# Patient Record
Sex: Male | Born: 1995 | Race: Black or African American | Hispanic: No | Marital: Single | Smoking: Current every day smoker
Health system: Southern US, Community
[De-identification: ages and names within clinical notes are randomized; demographics above are authoritative.]

## PROBLEM LIST (undated history)

## (undated) DIAGNOSIS — Y249XXA Unspecified firearm discharge, undetermined intent, initial encounter: Secondary | ICD-10-CM

## (undated) DIAGNOSIS — G8220 Paraplegia, unspecified: Secondary | ICD-10-CM

## (undated) DIAGNOSIS — S21339A Puncture wound without foreign body of unspecified front wall of thorax with penetration into thoracic cavity, initial encounter: Secondary | ICD-10-CM

## (undated) DIAGNOSIS — W3400XA Accidental discharge from unspecified firearms or gun, initial encounter: Secondary | ICD-10-CM

## (undated) DIAGNOSIS — Z789 Other specified health status: Secondary | ICD-10-CM

## (undated) DIAGNOSIS — G8929 Other chronic pain: Secondary | ICD-10-CM

## (undated) DIAGNOSIS — I10 Essential (primary) hypertension: Secondary | ICD-10-CM

## (undated) HISTORY — DX: Other chronic pain: G89.29

## (undated) HISTORY — PX: MENISCUS REPAIR: SHX5179

## (undated) HISTORY — DX: Puncture wound without foreign body of unspecified front wall of thorax with penetration into thoracic cavity, initial encounter: S21.339A

## (undated) HISTORY — DX: Paraplegia, unspecified: G82.20

## (undated) HISTORY — DX: Other specified health status: Z78.9

## (undated) HISTORY — DX: Essential (primary) hypertension: I10

---
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
## 2016-12-21 ENCOUNTER — Emergency Department (HOSPITAL_COMMUNITY): Payer: Medicaid Other

## 2016-12-21 ENCOUNTER — Inpatient Hospital Stay (HOSPITAL_COMMUNITY)
Admission: EM | Admit: 2016-12-21 | Discharge: 2017-01-10 | DRG: 199 | Disposition: A | Discharge status: Rehabilitation Facility | Payer: Medicaid Other | Attending: General Surgery | Admitting: General Surgery

## 2016-12-21 DIAGNOSIS — R509 Fever, unspecified: Secondary | ICD-10-CM

## 2016-12-21 DIAGNOSIS — S2241XA Multiple fractures of ribs, right side, initial encounter for closed fracture: Secondary | ICD-10-CM

## 2016-12-21 DIAGNOSIS — Z9689 Presence of other specified functional implants: Secondary | ICD-10-CM

## 2016-12-21 DIAGNOSIS — S91342A Puncture wound with foreign body, left foot, initial encounter: Secondary | ICD-10-CM | POA: Diagnosis present

## 2016-12-21 DIAGNOSIS — M62838 Other muscle spasm: Secondary | ICD-10-CM | POA: Diagnosis not present

## 2016-12-21 DIAGNOSIS — S270XXA Traumatic pneumothorax, initial encounter: Principal | ICD-10-CM | POA: Diagnosis present

## 2016-12-21 DIAGNOSIS — Z938 Other artificial opening status: Secondary | ICD-10-CM

## 2016-12-21 DIAGNOSIS — D62 Acute posthemorrhagic anemia: Secondary | ICD-10-CM | POA: Diagnosis present

## 2016-12-21 DIAGNOSIS — J95811 Postprocedural pneumothorax: Secondary | ICD-10-CM | POA: Diagnosis not present

## 2016-12-21 DIAGNOSIS — S21139A Puncture wound without foreign body of unspecified front wall of thorax without penetration into thoracic cavity, initial encounter: Secondary | ICD-10-CM

## 2016-12-21 DIAGNOSIS — S2241XB Multiple fractures of ribs, right side, initial encounter for open fracture: Secondary | ICD-10-CM | POA: Diagnosis present

## 2016-12-21 DIAGNOSIS — Z9889 Other specified postprocedural states: Secondary | ICD-10-CM

## 2016-12-21 DIAGNOSIS — W3400XA Accidental discharge from unspecified firearms or gun, initial encounter: Secondary | ICD-10-CM

## 2016-12-21 DIAGNOSIS — S22069B Unspecified fracture of T7-T8 vertebra, initial encounter for open fracture: Secondary | ICD-10-CM | POA: Diagnosis present

## 2016-12-21 DIAGNOSIS — J9382 Other air leak: Secondary | ICD-10-CM | POA: Diagnosis not present

## 2016-12-21 DIAGNOSIS — S0182XA Laceration with foreign body of other part of head, initial encounter: Secondary | ICD-10-CM | POA: Diagnosis present

## 2016-12-21 DIAGNOSIS — Z833 Family history of diabetes mellitus: Secondary | ICD-10-CM

## 2016-12-21 DIAGNOSIS — K5900 Constipation, unspecified: Secondary | ICD-10-CM | POA: Diagnosis present

## 2016-12-21 DIAGNOSIS — I959 Hypotension, unspecified: Secondary | ICD-10-CM | POA: Diagnosis present

## 2016-12-21 DIAGNOSIS — T794XXA Traumatic shock, initial encounter: Secondary | ICD-10-CM | POA: Diagnosis present

## 2016-12-21 DIAGNOSIS — J942 Hemothorax: Secondary | ICD-10-CM

## 2016-12-21 DIAGNOSIS — G8221 Paraplegia, complete: Secondary | ICD-10-CM | POA: Diagnosis present

## 2016-12-21 DIAGNOSIS — D72829 Elevated white blood cell count, unspecified: Secondary | ICD-10-CM | POA: Diagnosis present

## 2016-12-21 DIAGNOSIS — J939 Pneumothorax, unspecified: Secondary | ICD-10-CM

## 2016-12-21 DIAGNOSIS — S24109A Unspecified injury at unspecified level of thoracic spinal cord, initial encounter: Secondary | ICD-10-CM

## 2016-12-21 LAB — I-STAT CHEM 8, ED
BUN: 10 mg/dL (ref 6–20)
Calcium, Ion: 1.1 mmol/L — ABNORMAL LOW (ref 1.15–1.40)
Chloride: 104 mmol/L (ref 101–111)
Creatinine, Ser: 1.4 mg/dL — ABNORMAL HIGH (ref 0.61–1.24)
Glucose, Bld: 169 mg/dL — ABNORMAL HIGH (ref 65–99)
HCT: 41 % (ref 39.0–52.0)
Hemoglobin: 13.9 g/dL (ref 13.0–17.0)
Potassium: 4.1 mmol/L (ref 3.5–5.1)
Sodium: 140 mmol/L (ref 135–145)
TCO2: 20 mmol/L — ABNORMAL LOW (ref 22–32)

## 2016-12-21 LAB — TYPE AND SCREEN
ABO/RH(D): A POS
ANTIBODY SCREEN: NEGATIVE
Unit division: 0
Unit division: 0

## 2016-12-21 LAB — CBC
HCT: 39.2 % (ref 39.0–52.0)
Hemoglobin: 13.3 g/dL (ref 13.0–17.0)
MCH: 29.8 pg (ref 26.0–34.0)
MCHC: 33.9 g/dL (ref 30.0–36.0)
MCV: 87.7 fL (ref 78.0–100.0)
Platelets: 287 10*3/uL (ref 150–400)
RBC: 4.47 MIL/uL (ref 4.22–5.81)
RDW: 12.8 % (ref 11.5–15.5)
WBC: 6.1 10*3/uL (ref 4.0–10.5)

## 2016-12-21 LAB — PREPARE FRESH FROZEN PLASMA
UNIT DIVISION: 0
UNIT DIVISION: 0

## 2016-12-21 LAB — BPAM FFP
BLOOD PRODUCT EXPIRATION DATE: 201809152359
Blood Product Expiration Date: 201809152359
ISSUE DATE / TIME: 201809112127
ISSUE DATE / TIME: 201809112127
Unit Type and Rh: 600
Unit Type and Rh: 6200

## 2016-12-21 LAB — COMPREHENSIVE METABOLIC PANEL
ALT: 18 U/L (ref 17–63)
AST: 39 U/L (ref 15–41)
Albumin: 3.9 g/dL (ref 3.5–5.0)
Alkaline Phosphatase: 49 U/L (ref 38–126)
Anion gap: 16 — ABNORMAL HIGH (ref 5–15)
BUN: 8 mg/dL (ref 6–20)
CO2: 17 mmol/L — ABNORMAL LOW (ref 22–32)
Calcium: 8.9 mg/dL (ref 8.9–10.3)
Chloride: 105 mmol/L (ref 101–111)
Creatinine, Ser: 1.61 mg/dL — ABNORMAL HIGH (ref 0.61–1.24)
GFR calc Af Amer: >60 mL/min (ref >60)
GFR calc non Af Amer: >60 mL/min (ref >60)
Glucose, Bld: 171 mg/dL — ABNORMAL HIGH (ref 65–99)
Potassium: 4 mmol/L (ref 3.5–5.1)
Sodium: 138 mmol/L (ref 135–145)
Total Bilirubin: 1.2 mg/dL (ref 0.3–1.2)
Total Protein: 6.1 g/dL — ABNORMAL LOW (ref 6.5–8.1)

## 2016-12-21 LAB — BPAM RBC
BLOOD PRODUCT EXPIRATION DATE: 201809122359
BLOOD PRODUCT EXPIRATION DATE: 201809132359
ISSUE DATE / TIME: 201809112126
ISSUE DATE / TIME: 201809112126
UNIT TYPE AND RH: 9500
Unit Type and Rh: 9500

## 2016-12-21 LAB — ETHANOL: Alcohol, Ethyl (B): <5 mg/dL (ref <5)

## 2016-12-21 LAB — PROTIME-INR
INR: 1.09
Prothrombin Time: 14 seconds (ref 11.4–15.2)

## 2016-12-21 LAB — I-STAT CG4 LACTIC ACID, ED: Lactic Acid, Venous: 10.31 mmol/L (ref 0.5–1.9)

## 2016-12-21 LAB — ABO/RH: ABO/RH(D): A POS

## 2016-12-21 LAB — CDS SEROLOGY

## 2016-12-21 MED ORDER — FENTANYL CITRATE (PF) 100 MCG/2ML IJ SOLN
INTRAMUSCULAR | Status: AC | PRN
  Administered 2016-12-21: 50 ug via INTRAVENOUS

## 2016-12-21 MED ORDER — SODIUM CHLORIDE 0.9 % IV SOLN
INTRAVENOUS | Status: AC | PRN
  Administered 2016-12-21 (×2): 1000 mL via INTRAVENOUS
  Administered 2016-12-21: 100 mL/h via INTRAVENOUS

## 2016-12-21 MED ORDER — IOPAMIDOL (ISOVUE-300) INJECTION 61%
INTRAVENOUS | Status: AC
  Filled 2016-12-21: qty 100

## 2016-12-21 MED ORDER — KETAMINE HCL-SODIUM CHLORIDE 100-0.9 MG/10ML-% IV SOSY
PREFILLED_SYRINGE | INTRAVENOUS | Status: AC
  Filled 2016-12-21: qty 10

## 2016-12-21 MED ORDER — IOPAMIDOL (ISOVUE-300) INJECTION 61%
INTRAVENOUS | Status: AC
  Administered 2016-12-21: 75 mL
  Filled 2016-12-21: qty 75

## 2016-12-21 MED ORDER — KETAMINE HCL-SODIUM CHLORIDE 100-0.9 MG/10ML-% IV SOSY
100.0000 mg | PREFILLED_SYRINGE | Freq: Once | INTRAVENOUS | Status: AC
  Administered 2016-12-21: 100 mg via INTRAVENOUS

## 2016-12-21 NOTE — ED Notes (Signed)
TO CT AT THIS TIME
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
## 2016-12-21 NOTE — ED Notes (Signed)
UNABLE TO OBTAIN A MANUAL BLOOD PRESSURE. PT DIAPHORETIC, REMAINS ALERT.

## 2016-12-21 NOTE — ED Notes (Signed)
KOHUT NOTIFIED OF LACTIC ACID 10.31
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
## 2016-12-21 NOTE — ED Notes (Signed)
See physical diagram for wounds
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
## 2016-12-21 NOTE — ED Notes (Signed)
1524.94 in cash, yellow chain with clear stones, yellow pendant with clear stones and grey studs, white charger all given to [employer] badge number [id number] at this time.

## 2016-12-21 NOTE — ED Notes (Signed)
Large amount of cash sealed in valuables envelope with Vergia Alconasey H RN
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
## 2016-12-21 NOTE — ED Notes (Signed)
OFFERED TO CALL FAMILY, PT REPORTS "THEY ALREADY KNOW."

## 2016-12-22 ENCOUNTER — Inpatient Hospital Stay (HOSPITAL_COMMUNITY): Payer: Medicaid Other

## 2016-12-22 ENCOUNTER — Encounter (HOSPITAL_COMMUNITY): Payer: Self-pay | Admitting: Emergency Medicine

## 2016-12-22 DIAGNOSIS — I959 Hypotension, unspecified: Secondary | ICD-10-CM | POA: Diagnosis present

## 2016-12-22 DIAGNOSIS — W3400XA Accidental discharge from unspecified firearms or gun, initial encounter: Secondary | ICD-10-CM

## 2016-12-22 DIAGNOSIS — S91342A Puncture wound with foreign body, left foot, initial encounter: Secondary | ICD-10-CM | POA: Diagnosis present

## 2016-12-22 DIAGNOSIS — S22069B Unspecified fracture of T7-T8 vertebra, initial encounter for open fracture: Secondary | ICD-10-CM | POA: Diagnosis present

## 2016-12-22 DIAGNOSIS — S270XXA Traumatic pneumothorax, initial encounter: Secondary | ICD-10-CM | POA: Diagnosis present

## 2016-12-22 DIAGNOSIS — Z833 Family history of diabetes mellitus: Secondary | ICD-10-CM | POA: Diagnosis not present

## 2016-12-22 DIAGNOSIS — S21109A Unspecified open wound of unspecified front wall of thorax without penetration into thoracic cavity, initial encounter: Secondary | ICD-10-CM | POA: Diagnosis not present

## 2016-12-22 DIAGNOSIS — S21139A Puncture wound without foreign body of unspecified front wall of thorax without penetration into thoracic cavity, initial encounter: Secondary | ICD-10-CM

## 2016-12-22 DIAGNOSIS — M62838 Other muscle spasm: Secondary | ICD-10-CM | POA: Diagnosis not present

## 2016-12-22 DIAGNOSIS — R609 Edema, unspecified: Secondary | ICD-10-CM | POA: Diagnosis not present

## 2016-12-22 DIAGNOSIS — S0182XA Laceration with foreign body of other part of head, initial encounter: Secondary | ICD-10-CM | POA: Diagnosis present

## 2016-12-22 DIAGNOSIS — S24103S Unspecified injury at T7-T10 level of thoracic spinal cord, sequela: Secondary | ICD-10-CM | POA: Diagnosis not present

## 2016-12-22 DIAGNOSIS — J95811 Postprocedural pneumothorax: Secondary | ICD-10-CM | POA: Diagnosis not present

## 2016-12-22 DIAGNOSIS — M549 Dorsalgia, unspecified: Secondary | ICD-10-CM | POA: Diagnosis present

## 2016-12-22 DIAGNOSIS — G8220 Paraplegia, unspecified: Secondary | ICD-10-CM | POA: Diagnosis not present

## 2016-12-22 DIAGNOSIS — J9382 Other air leak: Secondary | ICD-10-CM | POA: Diagnosis not present

## 2016-12-22 DIAGNOSIS — D62 Acute posthemorrhagic anemia: Secondary | ICD-10-CM | POA: Diagnosis present

## 2016-12-22 DIAGNOSIS — G8221 Paraplegia, complete: Secondary | ICD-10-CM | POA: Diagnosis present

## 2016-12-22 DIAGNOSIS — T794XXA Traumatic shock, initial encounter: Secondary | ICD-10-CM | POA: Diagnosis present

## 2016-12-22 DIAGNOSIS — K5900 Constipation, unspecified: Secondary | ICD-10-CM | POA: Diagnosis present

## 2016-12-22 DIAGNOSIS — S2241XB Multiple fractures of ribs, right side, initial encounter for open fracture: Secondary | ICD-10-CM | POA: Diagnosis present

## 2016-12-22 DIAGNOSIS — D72829 Elevated white blood cell count, unspecified: Secondary | ICD-10-CM | POA: Diagnosis present

## 2016-12-22 LAB — CBC
HCT: 33 % — ABNORMAL LOW (ref 39.0–52.0)
Hemoglobin: 10.9 g/dL — ABNORMAL LOW (ref 13.0–17.0)
MCH: 29 pg (ref 26.0–34.0)
MCHC: 33 g/dL (ref 30.0–36.0)
MCV: 87.8 fL (ref 78.0–100.0)
PLATELETS: 243 10*3/uL (ref 150–400)
RBC: 3.76 MIL/uL — AB (ref 4.22–5.81)
RDW: 12.8 % (ref 11.5–15.5)
WBC: 22.1 10*3/uL — ABNORMAL HIGH (ref 4.0–10.5)

## 2016-12-22 LAB — URINALYSIS, ROUTINE W REFLEX MICROSCOPIC
Bilirubin Urine: NEGATIVE
Glucose, UA: NEGATIVE mg/dL
Hgb urine dipstick: NEGATIVE
Ketones, ur: NEGATIVE mg/dL
Leukocytes, UA: NEGATIVE
Nitrite: NEGATIVE
Protein, ur: 100 mg/dL — AB
Specific Gravity, Urine: 1.02 (ref 1.005–1.030)
Squamous Epithelial / LPF: NONE SEEN
pH: 7 (ref 5.0–8.0)

## 2016-12-22 LAB — BASIC METABOLIC PANEL
Anion gap: 5 (ref 5–15)
BUN: 8 mg/dL (ref 6–20)
CHLORIDE: 109 mmol/L (ref 101–111)
CO2: 24 mmol/L (ref 22–32)
CREATININE: 1.46 mg/dL — AB (ref 0.61–1.24)
Calcium: 7.9 mg/dL — ABNORMAL LOW (ref 8.9–10.3)
GFR calc non Af Amer: >60 mL/min (ref >60)
GLUCOSE: 158 mg/dL — AB (ref 65–99)
Potassium: 5.6 mmol/L — ABNORMAL HIGH (ref 3.5–5.1)
Sodium: 138 mmol/L (ref 135–145)

## 2016-12-22 LAB — PROTIME-INR
INR: 1.21
PROTHROMBIN TIME: 15.2 s (ref 11.4–15.2)

## 2016-12-22 LAB — MRSA PCR SCREENING: MRSA by PCR: NEGATIVE

## 2016-12-22 LAB — BLOOD PRODUCT ORDER (VERBAL) VERIFICATION

## 2016-12-22 LAB — LACTIC ACID, PLASMA: LACTIC ACID, VENOUS: 4.9 mmol/L — AB (ref 0.5–1.9)

## 2016-12-22 LAB — HIV ANTIBODY (ROUTINE TESTING W REFLEX): HIV Screen 4th Generation wRfx: NONREACTIVE

## 2016-12-22 MED ORDER — MORPHINE SULFATE (PF) 4 MG/ML IV SOLN
2.0000 mg | INTRAVENOUS | Status: DC | PRN
  Administered 2016-12-22 – 2016-12-24 (×21): 4 mg via INTRAVENOUS
  Administered 2016-12-25: 2 mg via INTRAVENOUS
  Filled 2016-12-22 (×23): qty 1

## 2016-12-22 MED ORDER — ACETAMINOPHEN 325 MG PO TABS
650.0000 mg | ORAL_TABLET | Freq: Four times a day (QID) | ORAL | Status: DC | PRN
  Administered 2016-12-22 – 2017-01-07 (×14): 650 mg via ORAL
  Filled 2016-12-22 (×14): qty 2

## 2016-12-22 MED ORDER — ONDANSETRON 4 MG PO TBDP
4.0000 mg | ORAL_TABLET | Freq: Four times a day (QID) | ORAL | Status: DC | PRN
  Filled 2016-12-22: qty 1

## 2016-12-22 MED ORDER — MORPHINE SULFATE (PF) 4 MG/ML IV SOLN
4.0000 mg | Freq: Once | INTRAVENOUS | Status: AC
  Administered 2016-12-22: 4 mg via INTRAVENOUS
  Filled 2016-12-22: qty 1

## 2016-12-22 MED ORDER — SODIUM CHLORIDE 0.9 % IV SOLN
INTRAVENOUS | Status: DC
  Administered 2016-12-22 – 2016-12-26 (×6): via INTRAVENOUS

## 2016-12-22 MED ORDER — PANTOPRAZOLE SODIUM 40 MG PO TBEC
40.0000 mg | DELAYED_RELEASE_TABLET | Freq: Every day | ORAL | Status: DC
  Administered 2016-12-23 – 2017-01-10 (×19): 40 mg via ORAL
  Filled 2016-12-22 (×20): qty 1

## 2016-12-22 MED ORDER — DIPHENHYDRAMINE HCL 25 MG PO CAPS
50.0000 mg | ORAL_CAPSULE | Freq: Four times a day (QID) | ORAL | Status: DC | PRN
  Administered 2016-12-22 – 2016-12-27 (×9): 50 mg via ORAL
  Filled 2016-12-22 (×9): qty 2

## 2016-12-22 MED ORDER — ONDANSETRON HCL 4 MG/2ML IJ SOLN
4.0000 mg | Freq: Four times a day (QID) | INTRAMUSCULAR | Status: DC | PRN
  Administered 2016-12-22 – 2016-12-26 (×2): 4 mg via INTRAVENOUS
  Filled 2016-12-22 (×2): qty 2

## 2016-12-22 MED ORDER — PANTOPRAZOLE SODIUM 40 MG IV SOLR
40.0000 mg | Freq: Every day | INTRAVENOUS | Status: DC
  Administered 2016-12-22: 40 mg via INTRAVENOUS
  Filled 2016-12-22: qty 40
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
## 2016-12-23 ENCOUNTER — Inpatient Hospital Stay (HOSPITAL_COMMUNITY): Payer: Medicaid Other

## 2016-12-23 LAB — CBC WITH DIFFERENTIAL/PLATELET
Basophils Absolute: 0 10*3/uL (ref 0.0–0.1)
Basophils Relative: 0 %
EOS PCT: 0 %
Eosinophils Absolute: 0 10*3/uL (ref 0.0–0.7)
HEMATOCRIT: 26.9 % — AB (ref 39.0–52.0)
HEMOGLOBIN: 8.8 g/dL — AB (ref 13.0–17.0)
LYMPHS ABS: 1.3 10*3/uL (ref 0.7–4.0)
LYMPHS PCT: 7 %
MCH: 28.6 pg (ref 26.0–34.0)
MCHC: 32.7 g/dL (ref 30.0–36.0)
MCV: 87.3 fL (ref 78.0–100.0)
Monocytes Absolute: 1.6 10*3/uL — ABNORMAL HIGH (ref 0.1–1.0)
Monocytes Relative: 9 %
NEUTROS ABS: 14.8 10*3/uL — AB (ref 1.7–7.7)
Neutrophils Relative %: 84 %
Platelets: 209 10*3/uL (ref 150–400)
RBC: 3.08 MIL/uL — AB (ref 4.22–5.81)
RDW: 13 % (ref 11.5–15.5)
WBC: 17.6 10*3/uL — AB (ref 4.0–10.5)

## 2016-12-23 LAB — BASIC METABOLIC PANEL
ANION GAP: 5 (ref 5–15)
BUN: 9 mg/dL (ref 6–20)
CHLORIDE: 101 mmol/L (ref 101–111)
CO2: 28 mmol/L (ref 22–32)
Calcium: 8.2 mg/dL — ABNORMAL LOW (ref 8.9–10.3)
Creatinine, Ser: 1.19 mg/dL (ref 0.61–1.24)
GFR calc Af Amer: >60 mL/min (ref >60)
GFR calc non Af Amer: >60 mL/min (ref >60)
GLUCOSE: 127 mg/dL — AB (ref 65–99)
POTASSIUM: 4 mmol/L (ref 3.5–5.1)
Sodium: 134 mmol/L — ABNORMAL LOW (ref 135–145)

## 2016-12-23 LAB — CBC
HEMATOCRIT: 25.4 % — AB (ref 39.0–52.0)
HEMOGLOBIN: 8.6 g/dL — AB (ref 13.0–17.0)
MCH: 29.5 pg (ref 26.0–34.0)
MCHC: 33.9 g/dL (ref 30.0–36.0)
MCV: 87 fL (ref 78.0–100.0)
Platelets: 204 10*3/uL (ref 150–400)
RBC: 2.92 MIL/uL — ABNORMAL LOW (ref 4.22–5.81)
RDW: 12.7 % (ref 11.5–15.5)
WBC: 19.3 10*3/uL — ABNORMAL HIGH (ref 4.0–10.5)

## 2016-12-23 MED ORDER — METOPROLOL TARTRATE 12.5 MG HALF TABLET
12.5000 mg | ORAL_TABLET | Freq: Two times a day (BID) | ORAL | Status: DC
  Administered 2016-12-23 – 2017-01-10 (×37): 12.5 mg via ORAL
  Filled 2016-12-23 (×38): qty 1

## 2016-12-23 MED ORDER — IBUPROFEN 200 MG PO TABS
400.0000 mg | ORAL_TABLET | Freq: Three times a day (TID) | ORAL | Status: DC | PRN
  Administered 2016-12-23 – 2016-12-31 (×9): 400 mg via ORAL
  Filled 2016-12-23: qty 2
  Filled 2016-12-23 (×7): qty 1
  Filled 2016-12-23: qty 2

## 2016-12-23 MED ORDER — METOPROLOL TARTRATE 5 MG/5ML IV SOLN
5.0000 mg | Freq: Once | INTRAVENOUS | Status: DC
  Filled 2016-12-23: qty 5

## 2016-12-23 MED ORDER — OXYCODONE HCL 5 MG PO TABS
5.0000 mg | ORAL_TABLET | ORAL | Status: DC | PRN
  Administered 2016-12-23 – 2016-12-25 (×7): 10 mg via ORAL
  Filled 2016-12-23 (×7): qty 2

## 2016-12-23 MED ORDER — METHOCARBAMOL 750 MG PO TABS
750.0000 mg | ORAL_TABLET | Freq: Three times a day (TID) | ORAL | Status: DC | PRN
  Administered 2016-12-25 – 2017-01-03 (×18): 750 mg via ORAL
  Filled 2016-12-23 (×8): qty 1
  Filled 2016-12-23: qty 2
  Filled 2016-12-23 (×3): qty 1
  Filled 2016-12-23: qty 2
  Filled 2016-12-23 (×2): qty 1
  Filled 2016-12-23: qty 2
  Filled 2016-12-23 (×2): qty 1

## 2016-12-23 MED ORDER — METOPROLOL TARTRATE 5 MG/5ML IV SOLN
5.0000 mg | INTRAVENOUS | Status: DC | PRN
  Administered 2016-12-23 – 2016-12-24 (×3): 5 mg via INTRAVENOUS
  Filled 2016-12-23 (×2): qty 5

## 2016-12-23 MED ORDER — METOPROLOL TARTRATE 5 MG/5ML IV SOLN
INTRAVENOUS | Status: AC
Start: 2016-12-23 — End: 2016-12-23
  Administered 2016-12-23: 5 mg
  Filled 2016-12-23: qty 5
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
## 2016-12-23 NOTE — Progress Notes (Signed)
Rehab Admissions Coordinator Note:  Patient was screened by Clois DupesBoyette, Adonnis Salceda Godwin for appropriateness for an Inpatient Acute Rehab Consult per PT recommendation.   At this time, we are recommending Inpatient Rehab consult.  Clois DupesBoyette, Harsha Yusko Godwin 12/23/2016, 3:48 PM  I can be reached at [phone].
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
## 2016-12-24 ENCOUNTER — Encounter (HOSPITAL_COMMUNITY): Payer: Self-pay | Admitting: Physical Medicine and Rehabilitation

## 2016-12-24 ENCOUNTER — Inpatient Hospital Stay (HOSPITAL_COMMUNITY): Payer: Medicaid Other

## 2016-12-24 DIAGNOSIS — S24103S Unspecified injury at T7-T10 level of thoracic spinal cord, sequela: Secondary | ICD-10-CM

## 2016-12-24 DIAGNOSIS — G8220 Paraplegia, unspecified: Secondary | ICD-10-CM

## 2016-12-24 LAB — URINALYSIS, ROUTINE W REFLEX MICROSCOPIC
Bilirubin Urine: NEGATIVE
Glucose, UA: NEGATIVE mg/dL
Hgb urine dipstick: NEGATIVE
Ketones, ur: 5 mg/dL — AB
NITRITE: NEGATIVE
PH: 6 (ref 5.0–8.0)
PROTEIN: 30 mg/dL — AB
SPECIFIC GRAVITY, URINE: 1.03 (ref 1.005–1.030)

## 2016-12-24 LAB — BASIC METABOLIC PANEL
Anion gap: 5 (ref 5–15)
BUN: 7 mg/dL (ref 6–20)
CALCIUM: 8.2 mg/dL — AB (ref 8.9–10.3)
CO2: 28 mmol/L (ref 22–32)
CREATININE: 1.24 mg/dL (ref 0.61–1.24)
Chloride: 104 mmol/L (ref 101–111)
GFR calc non Af Amer: >60 mL/min (ref >60)
Glucose, Bld: 113 mg/dL — ABNORMAL HIGH (ref 65–99)
Potassium: 3.7 mmol/L (ref 3.5–5.1)
SODIUM: 137 mmol/L (ref 135–145)

## 2016-12-24 LAB — CBC
HEMATOCRIT: 22.3 % — AB (ref 39.0–52.0)
HEMOGLOBIN: 7.6 g/dL — AB (ref 13.0–17.0)
MCH: 29.6 pg (ref 26.0–34.0)
MCHC: 34.1 g/dL (ref 30.0–36.0)
MCV: 86.8 fL (ref 78.0–100.0)
Platelets: 186 10*3/uL (ref 150–400)
RBC: 2.57 MIL/uL — ABNORMAL LOW (ref 4.22–5.81)
RDW: 13.1 % (ref 11.5–15.5)
WBC: 14.4 10*3/uL — ABNORMAL HIGH (ref 4.0–10.5)

## 2016-12-24 MED ORDER — BACITRACIN ZINC 500 UNIT/GM EX OINT
1.0000 "application " | TOPICAL_OINTMENT | Freq: Two times a day (BID) | CUTANEOUS | Status: DC
  Administered 2016-12-24 – 2017-01-10 (×32): 1 via TOPICAL
  Filled 2016-12-24 (×3): qty 28.35
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
## 2016-12-24 NOTE — Clinical Social Work Note (Signed)
Clinical Social Worker continuing to follow patient and family for support and discharge planning needs.  CSW spoke with patient and patient girlfriend at bedside who are planning for inpatient rehab and home at wheelchair level.  Patient inquired about chances of walking - MD notified and will speak with patient at bedside.  Clinical Social Worker inquired about current substance use.  Patient states that he uses marijuana daily but no concerns with any other substance.  Patient is willing to cease use and open to resources once discharged from inpatient rehab.  SBIRT completed.  CSW remains available for support and to assist with discharge planning needs.  Macario Golds, [place] [phone]

## 2016-12-24 NOTE — Progress Notes (Signed)
I met with pt and his girlfriend at bedside. We discussed an inpt rehab admit and he is in agreement. We discussed bowel and bladder programs and education. Pt asking many questions about whether he will be able to walk or not. I referred him to discuss with his MDs. He understands the expectation will be home at wheelchair level at this time. I will follow up Monday for admission when medically ready. [phone]
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
## 2016-12-25 ENCOUNTER — Inpatient Hospital Stay (HOSPITAL_COMMUNITY): Payer: Medicaid Other

## 2016-12-25 LAB — CBC
HCT: 22.8 % — ABNORMAL LOW (ref 39.0–52.0)
Hemoglobin: 7.6 g/dL — ABNORMAL LOW (ref 13.0–17.0)
MCH: 29 pg (ref 26.0–34.0)
MCHC: 33.3 g/dL (ref 30.0–36.0)
MCV: 87 fL (ref 78.0–100.0)
PLATELETS: 222 10*3/uL (ref 150–400)
RBC: 2.62 MIL/uL — ABNORMAL LOW (ref 4.22–5.81)
RDW: 13.2 % (ref 11.5–15.5)
WBC: 14.6 10*3/uL — AB (ref 4.0–10.5)

## 2016-12-25 LAB — URINE CULTURE
Culture: NO GROWTH
Special Requests: NORMAL

## 2016-12-25 LAB — BASIC METABOLIC PANEL
ANION GAP: 6 (ref 5–15)
BUN: 8 mg/dL (ref 6–20)
CALCIUM: 7.9 mg/dL — AB (ref 8.9–10.3)
CHLORIDE: 104 mmol/L (ref 101–111)
CO2: 26 mmol/L (ref 22–32)
CREATININE: 1.25 mg/dL — AB (ref 0.61–1.24)
GFR calc non Af Amer: >60 mL/min (ref >60)
Glucose, Bld: 131 mg/dL — ABNORMAL HIGH (ref 65–99)
Potassium: 3.1 mmol/L — ABNORMAL LOW (ref 3.5–5.1)
SODIUM: 136 mmol/L (ref 135–145)

## 2016-12-25 MED ORDER — MINERAL OIL RE ENEM
1.0000 | ENEMA | Freq: Every day | RECTAL | Status: DC | PRN
  Administered 2016-12-31: 1 via RECTAL
  Filled 2016-12-25 (×3): qty 1

## 2016-12-25 MED ORDER — OXYCODONE HCL 5 MG PO TABS
5.0000 mg | ORAL_TABLET | ORAL | Status: DC | PRN
  Administered 2016-12-25: 10 mg via ORAL
  Administered 2016-12-25 (×2): 15 mg via ORAL
  Administered 2016-12-26: 10 mg via ORAL
  Administered 2016-12-26 – 2017-01-03 (×44): 15 mg via ORAL
  Administered 2017-01-04: 5 mg via ORAL
  Administered 2017-01-04 (×2): 15 mg via ORAL
  Administered 2017-01-04: 5 mg via ORAL
  Administered 2017-01-04: 10 mg via ORAL
  Administered 2017-01-04 – 2017-01-07 (×16): 15 mg via ORAL
  Administered 2017-01-07: 10 mg via ORAL
  Administered 2017-01-07 – 2017-01-08 (×7): 15 mg via ORAL
  Administered 2017-01-09: 10 mg via ORAL
  Administered 2017-01-09 (×3): 15 mg via ORAL
  Administered 2017-01-09: 5 mg via ORAL
  Administered 2017-01-09 – 2017-01-10 (×4): 15 mg via ORAL
  Filled 2016-12-25 (×8): qty 3
  Filled 2016-12-25: qty 2
  Filled 2016-12-25 (×15): qty 3
  Filled 2016-12-25: qty 2
  Filled 2016-12-25 (×46): qty 3
  Filled 2016-12-25: qty 2
  Filled 2016-12-25 (×3): qty 3
  Filled 2016-12-25: qty 2
  Filled 2016-12-25: qty 3
  Filled 2016-12-25: qty 1
  Filled 2016-12-25 (×9): qty 3

## 2016-12-25 MED ORDER — POTASSIUM CHLORIDE CRYS ER 20 MEQ PO TBCR
20.0000 meq | EXTENDED_RELEASE_TABLET | Freq: Two times a day (BID) | ORAL | Status: AC
  Administered 2016-12-25 (×2): 20 meq via ORAL
  Filled 2016-12-25 (×2): qty 1

## 2016-12-25 MED ORDER — MORPHINE SULFATE (PF) 4 MG/ML IV SOLN
2.0000 mg | Freq: Four times a day (QID) | INTRAVENOUS | Status: DC | PRN
  Administered 2016-12-25 – 2016-12-26 (×2): 4 mg via INTRAVENOUS
  Filled 2016-12-25 (×2): qty 1
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
## 2016-12-25 NOTE — Progress Notes (Signed)
Recommend bowel program to begin. I have discussed with pt yesterday. [phone]

## 2016-12-26 ENCOUNTER — Inpatient Hospital Stay (HOSPITAL_COMMUNITY): Payer: Medicaid Other

## 2016-12-26 LAB — CBC
HCT: 21.2 % — ABNORMAL LOW (ref 39.0–52.0)
Hemoglobin: 7.2 g/dL — ABNORMAL LOW (ref 13.0–17.0)
MCH: 29.3 pg (ref 26.0–34.0)
MCHC: 34 g/dL (ref 30.0–36.0)
MCV: 86.2 fL (ref 78.0–100.0)
PLATELETS: 262 10*3/uL (ref 150–400)
RBC: 2.46 MIL/uL — AB (ref 4.22–5.81)
RDW: 12.8 % (ref 11.5–15.5)
WBC: 12.8 10*3/uL — AB (ref 4.0–10.5)

## 2016-12-26 LAB — BASIC METABOLIC PANEL
ANION GAP: 5 (ref 5–15)
BUN: 7 mg/dL (ref 6–20)
CO2: 26 mmol/L (ref 22–32)
Calcium: 8.1 mg/dL — ABNORMAL LOW (ref 8.9–10.3)
Chloride: 107 mmol/L (ref 101–111)
Creatinine, Ser: 1.03 mg/dL (ref 0.61–1.24)
Glucose, Bld: 121 mg/dL — ABNORMAL HIGH (ref 65–99)
POTASSIUM: 3.2 mmol/L — AB (ref 3.5–5.1)
SODIUM: 138 mmol/L (ref 135–145)

## 2016-12-26 MED ORDER — MORPHINE SULFATE (PF) 2 MG/ML IV SOLN
2.0000 mg | Freq: Three times a day (TID) | INTRAVENOUS | Status: DC | PRN
  Administered 2016-12-26 – 2017-01-03 (×7): 2 mg via INTRAVENOUS
  Filled 2016-12-26 (×9): qty 1

## 2016-12-26 MED ORDER — POTASSIUM CHLORIDE CRYS ER 20 MEQ PO TBCR
20.0000 meq | EXTENDED_RELEASE_TABLET | Freq: Two times a day (BID) | ORAL | Status: AC
  Administered 2016-12-26 (×2): 20 meq via ORAL
  Filled 2016-12-26 (×2): qty 1

## 2016-12-26 MED ORDER — ENOXAPARIN SODIUM 40 MG/0.4ML ~~LOC~~ SOLN
40.0000 mg | SUBCUTANEOUS | Status: DC
Start: 2016-12-26 — End: 2017-01-10
  Administered 2016-12-26 – 2017-01-09 (×15): 40 mg via SUBCUTANEOUS
  Filled 2016-12-26 (×15): qty 0.4
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
## 2016-12-26 NOTE — Progress Notes (Signed)
Assumed care from off going RN; throughout shift patient has been alert & oriented;chest tube removed about 1030; complained of pain or soreness; medication was given; check epic charting; patient was given bath and family assisted with putting patient in the chair @ 1420; chest xray completed and results called to DR. Andrey Campanile (Trauma MD). Patient placed on 2L of O2; family @ bedside; chest xray will be repeated early AM  Patient has some small bowel movements ; patient back to bed @ 1800; foley empty; family @ bedside

## 2016-12-26 NOTE — Progress Notes (Signed)
Pt transferred via air overlay bed with RN to [id number]. Receiving RN aware, report given. All belongings sent with patient and girlfriend. Pain medication given prior to transfer.

## 2016-12-27 ENCOUNTER — Inpatient Hospital Stay (HOSPITAL_COMMUNITY): Payer: Medicaid Other

## 2016-12-27 LAB — BASIC METABOLIC PANEL
ANION GAP: 10 (ref 5–15)
BUN: 9 mg/dL (ref 6–20)
CO2: 22 mmol/L (ref 22–32)
Calcium: 8.3 mg/dL — ABNORMAL LOW (ref 8.9–10.3)
Chloride: 105 mmol/L (ref 101–111)
Creatinine, Ser: 0.99 mg/dL (ref 0.61–1.24)
Glucose, Bld: 108 mg/dL — ABNORMAL HIGH (ref 65–99)
POTASSIUM: 3.4 mmol/L — AB (ref 3.5–5.1)
SODIUM: 137 mmol/L (ref 135–145)

## 2016-12-27 LAB — CBC
HCT: 27.1 % — ABNORMAL LOW (ref 39.0–52.0)
Hemoglobin: 9 g/dL — ABNORMAL LOW (ref 13.0–17.0)
MCH: 28.8 pg (ref 26.0–34.0)
MCHC: 33.2 g/dL (ref 30.0–36.0)
MCV: 86.9 fL (ref 78.0–100.0)
PLATELETS: 348 10*3/uL (ref 150–400)
RBC: 3.12 MIL/uL — AB (ref 4.22–5.81)
RDW: 12.9 % (ref 11.5–15.5)
WBC: 11.7 10*3/uL — AB (ref 4.0–10.5)
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
## 2016-12-27 NOTE — Progress Notes (Addendum)
I met with pt at bedside. I await medical readiness for d/c to inpt rehab. Hopeful soon. Pt is in agreement. [phone]

## 2016-12-27 NOTE — Progress Notes (Signed)
Pt arrived to unit via Air overlay bed. Oriented to unit.
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
## 2016-12-27 NOTE — Progress Notes (Signed)
Pt arrived to unit via Air overlay Bed. Oriented to unit.

## 2016-12-27 NOTE — Clinical Social Work Note (Signed)
Clinical Social Worker continuing to follow patient and family for support and discharge planning needs.  Patient will discharge to inpatient rehab once medically stable.  Clinical Social Worker will sign off for now as social work intervention is no longer needed. Please consult [place] again if new need arises.  Macario Golds, [place] [phone]

## 2016-12-27 NOTE — Progress Notes (Signed)
Patient demanding at this time for an "enema".  Patient insisting on being moved "on a lift" and given an "enema" as his "belly feels like its big".  Patient had bowel movement this morning, with positive bowel sounds.  Patient admits to passing gas, as does girlfriend at bedside.  Patient becoming belligerent, cursing stating he needs to have an enema performed at this instant.

## 2016-12-28 ENCOUNTER — Inpatient Hospital Stay (HOSPITAL_COMMUNITY): Payer: Medicaid Other

## 2016-12-28 LAB — CBC
HEMATOCRIT: 23.5 % — AB (ref 39.0–52.0)
Hemoglobin: 8 g/dL — ABNORMAL LOW (ref 13.0–17.0)
MCH: 29.4 pg (ref 26.0–34.0)
MCHC: 34 g/dL (ref 30.0–36.0)
MCV: 86.4 fL (ref 78.0–100.0)
PLATELETS: 410 10*3/uL — AB (ref 150–400)
RBC: 2.72 MIL/uL — AB (ref 4.22–5.81)
RDW: 12.9 % (ref 11.5–15.5)
WBC: 13.2 10*3/uL — AB (ref 4.0–10.5)
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
## 2016-12-29 ENCOUNTER — Inpatient Hospital Stay (HOSPITAL_COMMUNITY): Payer: Medicaid Other

## 2016-12-29 LAB — CBC
HEMATOCRIT: 26 % — AB (ref 39.0–52.0)
Hemoglobin: 8.6 g/dL — ABNORMAL LOW (ref 13.0–17.0)
MCH: 29 pg (ref 26.0–34.0)
MCHC: 33.1 g/dL (ref 30.0–36.0)
MCV: 87.5 fL (ref 78.0–100.0)
PLATELETS: 554 10*3/uL — AB (ref 150–400)
RBC: 2.97 MIL/uL — ABNORMAL LOW (ref 4.22–5.81)
RDW: 13.2 % (ref 11.5–15.5)
WBC: 13.4 10*3/uL — AB (ref 4.0–10.5)

## 2016-12-29 LAB — CULTURE, BLOOD (ROUTINE X 2)
CULTURE: NO GROWTH
CULTURE: NO GROWTH

## 2016-12-29 LAB — BASIC METABOLIC PANEL
Anion gap: 7 (ref 5–15)
BUN: 14 mg/dL (ref 6–20)
CALCIUM: 9.2 mg/dL (ref 8.9–10.3)
CO2: 27 mmol/L (ref 22–32)
CREATININE: 1.03 mg/dL (ref 0.61–1.24)
Chloride: 101 mmol/L (ref 101–111)
GFR calc Af Amer: >60 mL/min (ref >60)
Glucose, Bld: 108 mg/dL — ABNORMAL HIGH (ref 65–99)
POTASSIUM: 4.5 mmol/L (ref 3.5–5.1)
SODIUM: 135 mmol/L (ref 135–145)
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
## 2016-12-29 NOTE — Progress Notes (Addendum)
I met with pt and his girlfriend at bedside. They informed me that his Mom and he would like to look into other options for his rehab that Mom feels specializes in spinal cord injuries. Pt gave permission for me to contact his Mom by phone which I did in pt's presence. Mom would like to look into [place], [place] and [place] with inpt rehab here at [facility] to be only if he could not get into other facilities. I contacted RN CM and discussed. I await other facilities decisions before I pursue any further. I discussed with Bedside RN. I will follow. [phone]
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
## 2016-12-29 NOTE — Progress Notes (Signed)
OT Note Pt seen as cotreat with PT. Making excellent progress. Continue to recommend CIR for reahb. Full note to follow.  [facility], OT/L  [phone] 12/29/2016
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
## 2016-12-30 ENCOUNTER — Inpatient Hospital Stay (HOSPITAL_COMMUNITY): Payer: Medicaid Other

## 2016-12-30 LAB — CBC
HEMATOCRIT: 26.6 % — AB (ref 39.0–52.0)
Hemoglobin: 9 g/dL — ABNORMAL LOW (ref 13.0–17.0)
MCH: 29.5 pg (ref 26.0–34.0)
MCHC: 33.8 g/dL (ref 30.0–36.0)
MCV: 87.2 fL (ref 78.0–100.0)
Platelets: 636 10*3/uL — ABNORMAL HIGH (ref 150–400)
RBC: 3.05 MIL/uL — AB (ref 4.22–5.81)
RDW: 13.4 % (ref 11.5–15.5)
WBC: 13 10*3/uL — ABNORMAL HIGH (ref 4.0–10.5)

## 2016-12-30 MED ORDER — HYDROMORPHONE HCL 1 MG/ML IJ SOLN
1.0000 mg | Freq: Once | INTRAMUSCULAR | Status: AC
  Administered 2016-12-30: 1 mg via INTRAVENOUS
  Filled 2016-12-30: qty 1

## 2016-12-30 MED ORDER — ALUM & MAG HYDROXIDE-SIMETH 200-200-20 MG/5ML PO SUSP
15.0000 mL | ORAL | Status: DC | PRN
  Administered 2016-12-30: 15 mL via ORAL
  Filled 2016-12-30: qty 30
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
## 2016-12-30 NOTE — Progress Notes (Signed)
Patient said he was having difficulty breathing Oxygen saturation is normal with 17 RR/min, I gave [place] with 2 litre's of O 2 and educated him on the effects of opioids on respiration, will continue to monitor.

## 2016-12-30 NOTE — Progress Notes (Signed)
Letter provided for patient to excuse absence at court date tomorrow  Burna Sis, LCSW Clinical Social Worker [phone]

## 2016-12-30 NOTE — Progress Notes (Signed)
PT Cancellation Note  Patient Details Name: Alexander Nielsen MRN: [id number] DOB: [birth year]   Cancelled Treatment:    Reason Eval/Treat Not Completed: Pain limiting ability to participate.  Pt painful in both his back and new CT on right side.  He requested that PT hold today and check back tomorrow.  Thanks,    Rollene Rotunda. Tashara Suder, PT, DPT [phone]   12/30/2016, 6:13 PM
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
## 2016-12-30 NOTE — Procedures (Signed)
Chest Tube Insertion Procedure Note  Indications:  Clinically significant Pneumothorax  Pre-operative Diagnosis: Pneumothorax  Post-operative Diagnosis: Pneumothorax  Procedure Details  Informed consent was obtained for the procedure, including sedation.  Risks of lung perforation, hemorrhage, arrhythmia, and adverse drug reaction were discussed.   After sterile skin prep, using standard technique, a pigtail 14 French tube was placed in the right lateral rib space.  Findings: 10 ml of serosanguinous fluid obtained and air  Estimated Blood Loss:  Minimal         Specimens:  None              Complications:  None; patient tolerated the procedure well.         Disposition: floor with STAT chest xray         Condition: stable  Assistant: Dr. Siri Cole, [facility] Pager [phone]

## 2016-12-31 ENCOUNTER — Inpatient Hospital Stay (HOSPITAL_COMMUNITY): Payer: Medicaid Other

## 2016-12-31 LAB — CBC
HCT: 25.4 % — ABNORMAL LOW (ref 39.0–52.0)
Hemoglobin: 8.5 g/dL — ABNORMAL LOW (ref 13.0–17.0)
MCH: 29.3 pg (ref 26.0–34.0)
MCHC: 33.5 g/dL (ref 30.0–36.0)
MCV: 87.6 fL (ref 78.0–100.0)
PLATELETS: 657 10*3/uL — AB (ref 150–400)
RBC: 2.9 MIL/uL — ABNORMAL LOW (ref 4.22–5.81)
RDW: 13.6 % (ref 11.5–15.5)
WBC: 25.9 10*3/uL — ABNORMAL HIGH (ref 4.0–10.5)

## 2016-12-31 MED ORDER — POLYETHYLENE GLYCOL 3350 17 G PO PACK
17.0000 g | PACK | Freq: Every day | ORAL | Status: DC
  Administered 2016-12-31 – 2017-01-10 (×6): 17 g via ORAL
  Filled 2016-12-31 (×10): qty 1

## 2016-12-31 MED ORDER — DOCUSATE SODIUM 100 MG PO CAPS
100.0000 mg | ORAL_CAPSULE | Freq: Every day | ORAL | Status: DC
  Administered 2016-12-31 – 2017-01-10 (×11): 100 mg via ORAL
  Filled 2016-12-31 (×11): qty 1
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
## 2016-12-31 NOTE — Progress Notes (Signed)
I met with pt and his girlfriend at bedside. He states he understands he will likely d/c to [facility] rehab next week which is his preference. We will follow at a distance in case his preferences for [facility] are not able to offer a bed. [phone]

## 2017-01-01 LAB — BASIC METABOLIC PANEL
Anion gap: 8 (ref 5–15)
BUN: 15 mg/dL (ref 6–20)
CALCIUM: 8.9 mg/dL (ref 8.9–10.3)
CO2: 27 mmol/L (ref 22–32)
CREATININE: 0.88 mg/dL (ref 0.61–1.24)
Chloride: 99 mmol/L — ABNORMAL LOW (ref 101–111)
GFR calc Af Amer: >60 mL/min (ref >60)
GFR calc non Af Amer: >60 mL/min (ref >60)
GLUCOSE: 107 mg/dL — AB (ref 65–99)
Potassium: 4.2 mmol/L (ref 3.5–5.1)
Sodium: 134 mmol/L — ABNORMAL LOW (ref 135–145)

## 2017-01-01 LAB — CBC
HCT: 24.9 % — ABNORMAL LOW (ref 39.0–52.0)
HEMOGLOBIN: 8.3 g/dL — AB (ref 13.0–17.0)
MCH: 29.2 pg (ref 26.0–34.0)
MCHC: 33.3 g/dL (ref 30.0–36.0)
MCV: 87.7 fL (ref 78.0–100.0)
Platelets: 683 10*3/uL — ABNORMAL HIGH (ref 150–400)
RBC: 2.84 MIL/uL — ABNORMAL LOW (ref 4.22–5.81)
RDW: 13.5 % (ref 11.5–15.5)
WBC: 23 10*3/uL — ABNORMAL HIGH (ref 4.0–10.5)
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
## 2017-01-02 ENCOUNTER — Inpatient Hospital Stay (HOSPITAL_COMMUNITY): Payer: Medicaid Other
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
## 2017-01-02 NOTE — Progress Notes (Signed)
Foley leaking. Checked 7cc of NS. Empty 190cc tea colored urine.  Reinserted another foley.

## 2017-01-03 ENCOUNTER — Inpatient Hospital Stay (HOSPITAL_COMMUNITY): Payer: Medicaid Other

## 2017-01-03 LAB — CBC
HEMATOCRIT: 25.2 % — AB (ref 39.0–52.0)
Hemoglobin: 8.2 g/dL — ABNORMAL LOW (ref 13.0–17.0)
MCH: 28.9 pg (ref 26.0–34.0)
MCHC: 32.5 g/dL (ref 30.0–36.0)
MCV: 88.7 fL (ref 78.0–100.0)
Platelets: 773 10*3/uL — ABNORMAL HIGH (ref 150–400)
RBC: 2.84 MIL/uL — ABNORMAL LOW (ref 4.22–5.81)
RDW: 13.6 % (ref 11.5–15.5)
WBC: 15.2 10*3/uL — ABNORMAL HIGH (ref 4.0–10.5)

## 2017-01-03 MED ORDER — METHOCARBAMOL 500 MG PO TABS
1000.0000 mg | ORAL_TABLET | Freq: Three times a day (TID) | ORAL | Status: DC
  Administered 2017-01-03 – 2017-01-10 (×21): 1000 mg via ORAL
  Filled 2017-01-03 (×22): qty 2

## 2017-01-03 MED ORDER — HYPROMELLOSE (GONIOSCOPIC) 2.5 % OP SOLN
1.0000 [drp] | OPHTHALMIC | Status: DC | PRN
  Filled 2017-01-03: qty 15

## 2017-01-03 MED ORDER — POLYVINYL ALCOHOL 1.4 % OP SOLN
1.0000 [drp] | OPHTHALMIC | Status: DC | PRN
  Filled 2017-01-03: qty 15
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
## 2017-01-03 NOTE — Progress Notes (Signed)
OT Cancellation Note  Patient Details Name: Alexander Nielsen MRN: [id number] DOB: 02/07/1996   Cancelled Treatment:    Reason Eval/Treat Not Completed: Other (comment). Attempted to see this am. Pt requesting OT return in 2 hours when it is not so early.   [facility] Courteny Egler, OT/L  [phone] 01/03/2017 01/03/2017, 9:42 AM
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
## 2017-01-04 NOTE — Progress Notes (Signed)
[facility] admissions - Patient tells me that [facility] came to see him today.  He is hopeful for admit to [facility] tomorrow.  He seems excited about the [facility] as the next step.  I am following up for my partner who is off today.  #[phone]

## 2017-01-04 NOTE — Progress Notes (Signed)
Patient alert and oriented x 4 and calm today. Wound dressings clean, dry, and intact. No new drainage. Pain controlled with medications. No new concerns
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
## 2017-01-05 NOTE — Progress Notes (Signed)
PT Cancellation Note  Patient Details Name: Alexander Nielsen MRN: [id number] DOB: June 21, 1995   Cancelled Treatment:    Reason Eval/Treat Not Completed:  (patient with a lot of visitors, requesting PT come back)   Fabio Asa 01/05/2017, 1:46 PM Charlotte Crumb, PT DPT  Board Certified Neurologic Specialist [phone]
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
## 2017-01-06 ENCOUNTER — Inpatient Hospital Stay (HOSPITAL_COMMUNITY): Payer: Medicaid Other

## 2017-01-06 DIAGNOSIS — S21109A Unspecified open wound of unspecified front wall of thorax without penetration into thoracic cavity, initial encounter: Secondary | ICD-10-CM

## 2017-01-06 DIAGNOSIS — W3400XA Accidental discharge from unspecified firearms or gun, initial encounter: Secondary | ICD-10-CM

## 2017-01-06 DIAGNOSIS — R609 Edema, unspecified: Secondary | ICD-10-CM

## 2017-01-06 LAB — CBC
HEMATOCRIT: 28.5 % — AB (ref 39.0–52.0)
Hemoglobin: 9.3 g/dL — ABNORMAL LOW (ref 13.0–17.0)
MCH: 29.2 pg (ref 26.0–34.0)
MCHC: 32.6 g/dL (ref 30.0–36.0)
MCV: 89.3 fL (ref 78.0–100.0)
PLATELETS: 766 10*3/uL — AB (ref 150–400)
RBC: 3.19 MIL/uL — ABNORMAL LOW (ref 4.22–5.81)
RDW: 13.5 % (ref 11.5–15.5)
WBC: 10.4 10*3/uL (ref 4.0–10.5)

## 2017-01-06 NOTE — Progress Notes (Signed)
This nurse Assessed the wound on the Pt left medial ankle. Wounds seems to be red, warm to touch and swollen. The wound is bleeding a little more. This RN applied bacitracin and guaze to the site. Dr. Violeta Gelinas Was called and said he would Assess Pt. Tomorrow.

## 2017-01-06 NOTE — Progress Notes (Signed)
PT Cancellation Note  Patient Details Name: Alexander Nielsen MRN: [id number] DOB: Sep 23, 1995   Cancelled Treatment:    Reason Eval/Treat Not Completed: Other (comment).  Also awaiting LE [place] to r/o DVT before mobilizing.  Will try back later today as time allows.   Thanks,    Rollene Rotunda. Priyanka Causey, PT, DPT [phone]   01/06/2017, 12:37 PM

## 2017-01-06 NOTE — Progress Notes (Signed)
Alexander Nielsen noticed that the left lower leg is getting swelling and warm to touch, doctor on call notified and he stated that duplex will be ordered at AM. Bilateral lower leg elevated on pillows will continue monitor.
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
## 2017-01-06 NOTE — Progress Notes (Signed)
VASCULAR LAB PRELIMINARY  PRELIMINARY  PRELIMINARY  PRELIMINARY  Bilateral lower extremity venous duplex completed.    Preliminary report:  There is no DVT or SVT noted in the bilateral lower extremities.   Nneoma Harral, RVT 01/06/2017, 2:20 PM
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
## 2017-01-06 NOTE — Progress Notes (Signed)
OT Cancellation Note  Patient Details Name: Alexander Nielsen MRN: [id number] DOB: 07-10-1995   Cancelled Treatment:    Reason Eval/Treat Not Completed: Other (comment). Awaiting results from ultrasound for LLE before mobilizing.   [facility] Lylliana Kitamura, OT/L  [phone] 01/06/2017 01/06/2017, 10:02 AM

## 2017-01-07 MED ORDER — MORPHINE SULFATE (PF) 2 MG/ML IV SOLN: 1.0000 mg | Freq: Once | INTRAVENOUS | Status: DC
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
## 2017-01-07 NOTE — Progress Notes (Signed)
Orthopedic Tech Progress Note Patient Details:  Alexander Nielsen 1996/02/14 [id number]  Ortho Devices Type of Ortho Device: Other (comment) Ortho Device/Splint Location: Bi-Prafo Boots Ortho Device/Splint Interventions: Ordered I dropped boots off with pt. Pt was up in chair. RN will call when pt back in bed so I can come put them on.  Trinna Post 01/07/2017, 11:26 PM

## 2017-01-07 NOTE — Progress Notes (Signed)
Unfortunately, no bed this week at [facility] adult spinal cord rehab.  Pt/family unwilling to consider anywhere else for rehab.  [facility] admissions liaison to touch base with me early Monday AM regarding possible bed availability.    Will follow with updates as available.    Quintella Baton, RN, BSN  Trauma/Neuro ICU Case Manager [phone]
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
## 2017-01-08 MED ORDER — MAGNESIUM CITRATE PO SOLN
1.0000 | Freq: Once | ORAL | Status: AC
  Administered 2017-01-08: 1 via ORAL
  Filled 2017-01-08: qty 296

## 2017-01-08 NOTE — Progress Notes (Signed)
Orthopedic Tech Progress Note Patient Details:Alexander Nielsen S XXXKnight 05-Jun-1995 [id number]  Patient ID: Alexander Nielsen, male   DOB: 1996/03/12, 20 y.o.   MRN: [id number] I was called back to the unit to check and make sure the boots were on properly. They were on properly.   Trinna Post 01/08/2017, 2:36 AM

## 2017-01-08 NOTE — Progress Notes (Signed)
Received report on patient out side room, per families request. Patient receiving a bath. I was called in a few minutes later and put a new dressing on the R chest area where previous chest tube  Had been placed. Family in visiting. Ted hose on. Will continue to monitor. Safety maintained.
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
## 2017-01-09 MED ORDER — SENNA 8.6 MG PO TABS
1.0000 | ORAL_TABLET | Freq: Every day | ORAL | Status: DC | PRN
  Filled 2017-01-09: qty 1

## 2017-01-09 MED ORDER — BISACODYL 10 MG RE SUPP: 10.0000 mg | Freq: Every day | RECTAL | Status: DC | PRN

## 2017-01-09 NOTE — Plan of Care (Signed)
Problem: Pain Managment: Goal: General experience of comfort will improve Outcome: Progressing Repositioning, K pad for back relief.
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
## 2017-01-09 NOTE — Plan of Care (Signed)
Problem: Skin Integrity: Goal: Risk for impaired skin integrity will decrease Outcome: Progressing Repositioning, skin care, assessing skin each shift and wound care as applicable

## 2017-01-09 NOTE — Plan of Care (Signed)
Problem: Tissue Perfusion: Goal: Risk factors for ineffective tissue perfusion will decrease Outcome: Progressing Patient educated on SCDS. Refuses. Is currently wearing TEDs.

## 2017-01-09 NOTE — Progress Notes (Signed)
Patient requested that I call and get order for Linzees for constipation. I have paged the number [phone] for Dr Derrell Lolling. Patient's Urine is amber with sediment. Will inquire regarding getting a urine spec. Awaiting call back.

## 2017-01-09 NOTE — Plan of Care (Signed)
Problem: Bowel/Gastric: Goal: Will not experience complications related to bowel motility Outcome: Progressing Encouraging fluids, Magnesium citrate given. Prune juice given. Monitoring bowel sounds

## 2017-01-09 NOTE — Progress Notes (Signed)
Received call back from Dr Derrell Lolling. Okay to order [facility] for patient. Also ordered magnesium citrate.

## 2017-01-10 MED ORDER — METOPROLOL TARTRATE 25 MG PO TABS: 12.5000 mg | ORAL_TABLET | Freq: Two times a day (BID) | ORAL | Status: DC

## 2017-01-10 MED ORDER — DOCUSATE SODIUM 100 MG PO CAPS: 100.0000 mg | ORAL_CAPSULE | Freq: Every day | ORAL | 0 refills | Status: DC

## 2017-01-10 MED ORDER — ENOXAPARIN SODIUM 40 MG/0.4ML ~~LOC~~ SOLN: 40.0000 mg | SUBCUTANEOUS | Status: DC

## 2017-01-10 MED ORDER — BACITRACIN ZINC 500 UNIT/GM EX OINT: 1.0000 "application " | TOPICAL_OINTMENT | Freq: Two times a day (BID) | CUTANEOUS | 0 refills | Status: AC

## 2017-01-10 MED ORDER — SENNA 8.6 MG PO TABS: 1.0000 | ORAL_TABLET | Freq: Every day | ORAL | 0 refills | Status: DC | PRN

## 2017-01-10 MED ORDER — METHOCARBAMOL 500 MG PO TABS: 1000.0000 mg | ORAL_TABLET | Freq: Three times a day (TID) | ORAL | 0 refills | Status: DC

## 2017-01-10 MED ORDER — OXYCODONE HCL 5 MG PO TABS: 5.0000 mg | ORAL_TABLET | ORAL | 0 refills | Status: DC | PRN

## 2017-01-10 MED ORDER — PANTOPRAZOLE SODIUM 40 MG PO TBEC: 40.0000 mg | DELAYED_RELEASE_TABLET | Freq: Every day | ORAL | Status: DC

## 2017-01-10 NOTE — Progress Notes (Signed)
[place], Dr Derrell Lolling returned call.  He gave order for ducolax suppository as needed, also senna as needed.  He did not want urine sent at this time.  I offered patient suppository, he turned it down.  I also offered pain medication at 0300, patient refused, saying he would call me. Will continue to monitorl.

## 2017-01-10 NOTE — Progress Notes (Signed)
Pt being discharged from hospital per orders from MD. Pt and family made aware of transfer. Pt and family verbalized understanding of transfer. Pt's IV was removed prior to discharge. RN called and gave report to nurse @ [phone]. RN also gave report to [facility]. Pt exited hospital via stretcher.

## 2017-01-11 ENCOUNTER — Encounter (HOSPITAL_COMMUNITY): Payer: Self-pay | Admitting: Emergency Medicine

## 2017-02-16 ENCOUNTER — Ambulatory Visit: Payer: Medicaid Other | Attending: Nurse Practitioner | Admitting: Nurse Practitioner

## 2017-02-16 ENCOUNTER — Encounter: Payer: Self-pay | Admitting: Nurse Practitioner

## 2017-02-16 VITALS — BP 149/89 | HR 105 | Temp 98.3°F

## 2017-02-16 DIAGNOSIS — Z79899 Other long term (current) drug therapy: Secondary | ICD-10-CM | POA: Diagnosis not present

## 2017-02-16 DIAGNOSIS — I10 Essential (primary) hypertension: Secondary | ICD-10-CM | POA: Diagnosis not present

## 2017-02-16 DIAGNOSIS — W3400XD Accidental discharge from unspecified firearms or gun, subsequent encounter: Secondary | ICD-10-CM | POA: Diagnosis not present

## 2017-02-16 DIAGNOSIS — Z23 Encounter for immunization: Secondary | ICD-10-CM | POA: Diagnosis not present

## 2017-02-16 DIAGNOSIS — S24159D Other incomplete lesion at unspecified level of thoracic spinal cord, subsequent encounter: Secondary | ICD-10-CM | POA: Insufficient documentation

## 2017-02-16 DIAGNOSIS — S24109S Unspecified injury at unspecified level of thoracic spinal cord, sequela: Secondary | ICD-10-CM | POA: Diagnosis not present

## 2017-02-16 DIAGNOSIS — Z8249 Family history of ischemic heart disease and other diseases of the circulatory system: Secondary | ICD-10-CM | POA: Diagnosis not present

## 2017-02-16 DIAGNOSIS — M549 Dorsalgia, unspecified: Secondary | ICD-10-CM | POA: Insufficient documentation

## 2017-02-16 DIAGNOSIS — G8921 Chronic pain due to trauma: Secondary | ICD-10-CM

## 2017-02-16 DIAGNOSIS — R899 Unspecified abnormal finding in specimens from other organs, systems and tissues: Secondary | ICD-10-CM | POA: Diagnosis not present

## 2017-02-16 DIAGNOSIS — Z833 Family history of diabetes mellitus: Secondary | ICD-10-CM | POA: Insufficient documentation

## 2017-02-16 DIAGNOSIS — G8220 Paraplegia, unspecified: Secondary | ICD-10-CM | POA: Insufficient documentation

## 2017-02-16 DIAGNOSIS — Z79891 Long term (current) use of opiate analgesic: Secondary | ICD-10-CM | POA: Diagnosis not present

## 2017-02-16 DIAGNOSIS — Z9889 Other specified postprocedural states: Secondary | ICD-10-CM | POA: Insufficient documentation

## 2017-02-16 DIAGNOSIS — S24109A Unspecified injury at unspecified level of thoracic spinal cord, initial encounter: Secondary | ICD-10-CM | POA: Insufficient documentation

## 2017-02-16 MED ORDER — METHOCARBAMOL 500 MG PO TABS: 1000.0000 mg | ORAL_TABLET | Freq: Three times a day (TID) | ORAL | 0 refills | Status: DC

## 2017-02-16 MED ORDER — OXYCODONE HCL 5 MG PO TABS: 5.0000 mg | ORAL_TABLET | ORAL | 0 refills | Status: DC | PRN

## 2017-02-16 MED ORDER — METOPROLOL TARTRATE 25 MG PO TABS: 12.5000 mg | ORAL_TABLET | Freq: Two times a day (BID) | ORAL | 1 refills | Status: DC

## 2017-02-16 MED ORDER — DICLOFENAC SODIUM 1 % TD GEL: 2.0000 g | Freq: Four times a day (QID) | TRANSDERMAL | 1 refills | Status: DC
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
## 2017-02-16 NOTE — Patient Instructions (Addendum)
If your BP is 140/90 or greater please call me so that we can adjust your medication. Also if your HR is >100 please call me.                 m   Spinal Cord Injury Your spinal cord is a long bundle of nerve cells (neurons) and nerve fibers (axons). It carries messages back and forth between your brain and the rest of your body. A spinal cord injury (SCI) blocks the pathway for these messages. SCI can happen anywhere along your spine. The higher the injury, the more impact it has on your body. If the injury occurs close to your brain, such as in your neck, sensation and movement capabilities below that may be affected. What are the causes? The spinal cord most often gets injured when the bones that make up the spine are damaged. A spinal bone (vertebra) that gets broken or displaced can crush or tear neurons and axons. The soft tissues between the vertebrae (discs) can bulge out and put pressure on the spinal cord. Common causes of a SCI include:  Auto accidents.  Birth injuries.  Falling, especially for people 5965 and older.  Gunshot wounds.  Sports injuries, especially in people younger than 30.  Trampoline accidents.  Motorcycle accidents.  Surgical injuries.  Diving into: ? Shallow water. ? Water that contains debris or obstacles.  Infections that involve the spinal cord.  What increases the risk? You may be at greater risk for SCI if you:  Are a male between the ages of 4316 and 8630.  Do activities that are more likely to cause SCI such as high velocity sports, or those with a higher chance of falling on your head, neck, or back.  What are the signs or symptoms? Signs and symptoms of SCI depend on the location and severity of the injury. There are two kinds of spinal injuries.  Complete. Thiscauses total loss of feeling, movement, and function below the level of the injury.  Incomplete. This leaves some feeling, movement, or function below the level of the  injury.  Signs and symptoms may include:  Partial or total loss of movement.  Partial or total loss of feeling.  Difficulty breathing.  Loss of bladder control.  Sensations of pain or pressure.  Tingling in your hands, fingers, feet, or toes.  The spinal cord is divided into sections that control different parts of your body. Where you experience signs and symptoms depends on which segment of your spinal cord is damaged:  Your neck (cervical) segment controls nerve signals for your neck, arms, and hands.  Your upper back (thoracic) segment controls nerve signals for your mid-body (torso).  Your lower back (lumbar) segment controls nerve signals for your hips and legs.  Your tailbone (sacral) segment controls nerve signals for your groin and toes.  How is this diagnosed? Your health care provider may diagnose SCI based on the trauma you experienced and finding signs or symptoms below the level of your injury (neurological deficits). Your health care provider will also do a physical exam. This may include imaging studies of your spine. These are the best way to confirm the diagnosis. Imaging studies may include:  X-rays.  CT scans.  MRI.  How is this treated? Emergency treatment of a suspected SCI includes stabilizing your spine to prevent any further injury. You may:  Wear a rigid neck brace. This keeps your neck from moving.  Be strapped to a board to prevent the  rest of your spine from moving.  Be given breathing support, if necessary.  Immediate treatment may include:  Admission to intensive care for support of your breathing and blood pressure.  Stretching (traction) on your spine to relieve pressure on your spinal cord.  Surgery to relieve pressure on the spine from a bone, blood clot, foreign body, or bulging disc.  Anti-inflammatory drugs (steroids) to reduce swelling.  There is no cure for spinal cord injury. Long-term care, therapy, and support is  provided by your team of health care providers and may include family caregivers as well. A complete SCI usually requires more long-term care and support than an incomplete SCI. Therapy may include:  Physical therapy exercises to strengthen muscles and prevent stiffness.  Occupational therapy to make your home or work environment safe and manageable.  Social and emotional support.  Follow these instructions at home:  Take medicines only as directed by your health care provider.  Keep all follow-up visits as directed by your health care provider. This is important.  Work closely with all your health care providers. These may include therapists to help you: ? Exercise. ? Get the right amount and type of nutrition. ? Take care of yourself at home and in the community. ? Manage bowel and bladder problems. ? Cope with mental health problems. ? Manage pain.  Make sure you have a good support system at home. Let someone know if you are struggling with anxiety or depression. Contact a health care provider if:  You have a cough or shortness of breath.  You have chills or fever.  Your symptoms gradually change or get worse.  You need more support at home. Get help right away if:  Your symptoms suddenly get worse.  You have chest pain or trouble breathing.  You do not feel safe at home. This information is not intended to replace advice given to you by your health care provider. Make sure you discuss any questions you have with your health care provider. Document Released: 03/19/2002 Document Revised: 11/25/2015 Document Reviewed: 07/13/2013 Elsevier Interactive Patient Education  2017 Elsevier Inc.  Back Pain, Adult Back pain is very common. The pain often gets better over time. The cause of back pain is usually not dangerous. Most people can learn to manage their back pain on their own. Follow these instructions at home: Watch your back pain for any changes. The following  actions may help to lessen any pain you are feeling:  Stay active. Start with short walks on flat ground if you can. Try to walk farther each day.  Exercise regularly as told by your doctor. Exercise helps your back heal faster. It also helps avoid future injury by keeping your muscles strong and flexible.  Do not sit, drive, or stand in one place for more than 30 minutes.  Do not stay in bed. Resting more than 1-2 days can slow down your recovery.  Be careful when you bend or lift an object. Use good form when lifting: ? Bend at your knees. ? Keep the object close to your body. ? Do not twist.  Sleep on a firm mattress. Lie on your side, and bend your knees. If you lie on your back, put a pillow under your knees.  Take medicines only as told by your doctor.  Put ice on the injured area. ? Put ice in a plastic bag. ? Place a towel between your skin and the bag. ? Leave the ice on for 20 minutes, 2-3  times a day for the first 2-3 days. After that, you can switch between ice and heat packs.  Avoid feeling anxious or stressed. Find good ways to deal with stress, such as exercise.  Maintain a healthy weight. Extra weight puts stress on your back.  Contact a doctor if:  You have pain that does not go away with rest or medicine.  You have worsening pain that goes down into your legs or buttocks.  You have pain that does not get better in one week.  You have pain at night.  You lose weight.  You have a fever or chills. Get help right away if:  You cannot control when you poop (bowel movement) or pee (urinate).  Your arms or legs feel weak.  Your arms or legs lose feeling (numbness).  You feel sick to your stomach (nauseous) or throw up (vomit).  You have belly (abdominal) pain.  You feel like you may pass out (faint). This information is not intended to replace advice given to you by your health care provider. Make sure you discuss any questions you have with your  health care provider. Document Released: 09/15/2007 Document Revised: 09/04/2015 Document Reviewed: 07/31/2013 Elsevier Interactive Patient Education  [employer]2018 Elsevier Inc.

## 2017-02-17 ENCOUNTER — Telehealth: Payer: Self-pay | Admitting: Nurse Practitioner

## 2017-02-17 LAB — BASIC METABOLIC PANEL
BUN / CREAT RATIO: 19 (ref 9–20)
BUN: 16 mg/dL (ref 6–20)
CO2: 25 mmol/L (ref 20–29)
CREATININE: 0.86 mg/dL (ref 0.76–1.27)
Calcium: 10.6 mg/dL — ABNORMAL HIGH (ref 8.7–10.2)
Chloride: 100 mmol/L (ref 96–106)
GFR calc Af Amer: 144 mL/min/{1.73_m2} (ref >59)
GFR calc non Af Amer: 125 mL/min/{1.73_m2} (ref >59)
GLUCOSE: 85 mg/dL (ref 65–99)
POTASSIUM: 5.2 mmol/L (ref 3.5–5.2)
SODIUM: 143 mmol/L (ref 134–144)

## 2017-02-17 LAB — CBC
Hematocrit: 41.8 % (ref 37.5–51.0)
Hemoglobin: 13.7 g/dL (ref 13.0–17.7)
MCH: 28.9 pg (ref 26.6–33.0)
MCHC: 32.8 g/dL (ref 31.5–35.7)
MCV: 88 fL (ref 79–97)
PLATELETS: 403 10*3/uL — AB (ref 150–379)
RBC: 4.74 x10E6/uL (ref 4.14–5.80)
RDW: 13.4 % (ref 12.3–15.4)
WBC: 5.8 10*3/uL (ref 3.4–10.8)

## 2017-02-17 NOTE — Telephone Encounter (Signed)
Patient says that the pharmacy called and said the prescription was not written correctly. They could not pick up the oxyCODNE. Please fu

## 2017-02-17 NOTE — Telephone Encounter (Signed)
Will defer to PCP, I cannot change or update controlled substance prescriptions

## 2017-02-18 NOTE — Telephone Encounter (Signed)
Per pharmacy whom I just spoke to. This medication requires prior authorization through medicaid. Can you follow up and let me know if there is anything I need to do to assist with filling. Thank you!!!

## 2017-02-18 NOTE — Telephone Encounter (Signed)
Prior authorization completed and is pending review by [place] Medicaid.  [place] Medicaid ID [id number] P  Confirmation: [id number] W  Can take up to 72 business hours for approval/refusal

## 2017-02-24 ENCOUNTER — Telehealth: Payer: Self-pay

## 2017-02-24 NOTE — Telephone Encounter (Signed)
-----   Message from Claiborne RiggZelda W Fleming, NP sent at 02/24/2017  9:05 AM EST ----- Please inform patient that laboratory results are minimally abnormal. These lab abnormalities do not warrant additional work up at this time. We will continue to monitor for any trending on future lab results. Continue healthy eating habit and regular physical exercise at least 3 times a week, 50 minutes each time.

## 2017-02-24 NOTE — Telephone Encounter (Signed)
Oxy was just filled on the 7th for the patient. Which pain medication is she requesting?

## 2017-02-24 NOTE — Telephone Encounter (Signed)
Patient mother answered the phone and informed on lab result.    Patient mother is on his designated party release.

## 2017-02-24 NOTE — Telephone Encounter (Signed)
Patients mother called asking for pain medication for the patient. Please fu

## 2017-02-24 NOTE — Telephone Encounter (Signed)
Patient's mother said she is aware that is filled but she is waiting for medicaid to approve it and would like to see if there is any other pain medication.

## 2017-02-28 ENCOUNTER — Ambulatory Visit (INDEPENDENT_AMBULATORY_CARE_PROVIDER_SITE_OTHER): Payer: Self-pay | Admitting: Physician Assistant

## 2017-03-08 ENCOUNTER — Telehealth: Payer: Self-pay | Admitting: Pharmacist

## 2017-03-08 NOTE — Telephone Encounter (Signed)
Though [place], pharmacy is reporting that prior authorization is still needed. Resubmitted prior authorization for oxycodone and it is pending review by [facility] [place] [facility].   Confirmation #: [id number]

## 2017-03-08 NOTE — Telephone Encounter (Signed)
[place] 02[place]18/17 per [employer].

## 2017-03-09 NOTE — Telephone Encounter (Signed)
Please let the patient know the oxycodone was approved on 02-18-2017. Thank you

## 2017-03-09 NOTE — Telephone Encounter (Signed)
Patient's mother called the [facility] pharmacy stated that they said it need prior authorization approval.  Patient's mother called the [facility] pharmacy this morning.

## 2017-03-11 NOTE — Telephone Encounter (Signed)
Do you want to me to call the patient and inform them?

## 2017-03-11 NOTE — Telephone Encounter (Signed)
Thank you I have called the [facility] pharmacy and given them the confirmation number. They will call Medicaid to verify

## 2017-03-14 ENCOUNTER — Telehealth: Payer: Self-pay | Admitting: Nurse Practitioner

## 2017-03-14 NOTE — Telephone Encounter (Signed)
Pt mother called to check on the pre auth for her son medication please call them back

## 2017-03-14 NOTE — Telephone Encounter (Signed)
Checked [employer] and prior authorization was still pending. Called [place] Medicaid and they said it was pending because we needed to submitted documentation on why patient needed to be on oxycodone since patient has already received a supply of an opioid. Submitted last office note and justification for continued use for opioid through [employer]. They report it will take up to 3 business days for approval/refusal.   Called patient's mother and informed her. She verbalized understanding.

## 2017-03-17 ENCOUNTER — Telehealth: Payer: Self-pay | Admitting: Nurse Practitioner

## 2017-03-17 NOTE — Telephone Encounter (Signed)
Patients mother called asking for the prior authorization to be sent for the tramadol

## 2017-03-17 NOTE — Telephone Encounter (Signed)
Received call from [employer], patient's mother was inquiring into the status of the PA for oxycodone. Reviewed [employer], oxycodone was approved 03/14/17 - 09/10/17. [facility] [employer] will communicate this information to the patient's mother.

## 2017-03-17 NOTE — Telephone Encounter (Signed)
Alexander BodoQuianna came into my office to let me know that patient's mother was requesting a PA for tramadol. We did not prescribe tramadol so I will not do a PA for it. Would recommend checking controlled substance database before prescribing any other controlled substances for patient.

## 2017-04-13 NOTE — Telephone Encounter (Signed)
Received letter in the mail from [facility] [facility] that [place] for oxycodone was denied. They need a letter from the provider that has the following:  Please provide a description of how the requested medication will correct or ameliorate the recipient's condition, prevent it from worsening, or prevent the development of additional health problems.   I submitted the care plan and the need for continued use in December but this was denied. Will need letter from provider with the above requested information. Will forward to PCP

## 2017-05-20 ENCOUNTER — Ambulatory Visit: Payer: Medicaid Other | Admitting: Nurse Practitioner

## 2017-07-04 ENCOUNTER — Emergency Department (HOSPITAL_COMMUNITY)
Admission: EM | Admit: 2017-07-04 | Discharge: 2017-07-04 | Disposition: A | Discharge status: Home / Self Care | Payer: Medicaid Other | Attending: Emergency Medicine | Admitting: Emergency Medicine

## 2017-07-04 ENCOUNTER — Encounter (HOSPITAL_COMMUNITY): Payer: Self-pay

## 2017-07-04 DIAGNOSIS — R079 Chest pain, unspecified: Secondary | ICD-10-CM | POA: Insufficient documentation

## 2017-07-04 DIAGNOSIS — Z5321 Procedure and treatment not carried out due to patient leaving prior to being seen by health care provider: Secondary | ICD-10-CM | POA: Diagnosis not present

## 2017-07-04 HISTORY — DX: Accidental discharge from unspecified firearms or gun, initial encounter: W34.00XA

## 2017-07-04 HISTORY — DX: Unspecified firearm discharge, undetermined intent, initial encounter: Y24.9XXA
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
## 2017-07-04 NOTE — ED Notes (Signed)
Pt called to be roomed, no response in lobby x2.

## 2017-07-04 NOTE — ED Notes (Signed)
Called Pt in lobby for vital recheck. No response in lobby x1. 

## 2017-07-05 ENCOUNTER — Emergency Department (HOSPITAL_COMMUNITY)
Admission: EM | Admit: 2017-07-05 | Discharge: 2017-07-05 | Disposition: A | Discharge status: Home / Self Care | Payer: Medicaid Other | Attending: Emergency Medicine | Admitting: Emergency Medicine

## 2017-07-05 ENCOUNTER — Emergency Department (HOSPITAL_COMMUNITY): Payer: Medicaid Other

## 2017-07-05 ENCOUNTER — Encounter (HOSPITAL_COMMUNITY): Payer: Self-pay

## 2017-07-05 DIAGNOSIS — R0781 Pleurodynia: Secondary | ICD-10-CM

## 2017-07-05 DIAGNOSIS — I10 Essential (primary) hypertension: Secondary | ICD-10-CM | POA: Diagnosis not present

## 2017-07-05 DIAGNOSIS — Z79899 Other long term (current) drug therapy: Secondary | ICD-10-CM | POA: Insufficient documentation

## 2017-07-05 DIAGNOSIS — R0789 Other chest pain: Secondary | ICD-10-CM | POA: Insufficient documentation

## 2017-07-05 MED ORDER — IBUPROFEN 800 MG PO TABS: 800.0000 mg | ORAL_TABLET | Freq: Three times a day (TID) | ORAL | 0 refills | Status: DC

## 2017-07-05 MED ORDER — OXYCODONE-ACETAMINOPHEN 5-325 MG PO TABS
1.0000 | ORAL_TABLET | Freq: Once | ORAL | Status: AC
  Administered 2017-07-05: 1 via ORAL
  Filled 2017-07-05: qty 1
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
## 2017-07-05 NOTE — Discharge Instructions (Signed)
Take 1 tablet of ibuprofen with food every 8 hours for pain.  Is important to take this medication with food to avoid upsetting your  stomach or getting an ulcer.   Please keep your follow-up appointment with Dr. Angela NevinLieberman on April 5.  If you develop new or worsening symptoms including pain with taking a deep breath in with shortness of breath, a cough with thick sputum, fever, or if the skin overlying the pain becomes red, hot, or swollen to the touch, please return to the emergency department for reevaluation.

## 2017-07-05 NOTE — ED Triage Notes (Signed)
Pt complains of right side pain in the area where he had a chest tube last year, he says it hurts to take a deep breath

## 2017-09-28 ENCOUNTER — Telehealth: Payer: Self-pay | Admitting: Nurse Practitioner

## 2017-09-28 NOTE — Telephone Encounter (Signed)
Is there a particular walker she is requesting? Will need to know what type of walker she would like a script for

## 2017-09-28 NOTE — Telephone Encounter (Signed)
Patient mother called requesting an Rx for a walker. Please f/u

## 2017-09-28 NOTE — Telephone Encounter (Signed)
Will route to PCP 

## 2017-09-29 NOTE — Telephone Encounter (Signed)
CMA spoke to patient's mother.  Patient's mother stated a walker that locks when he get tired so he can sit on it.

## 2017-10-06 ENCOUNTER — Other Ambulatory Visit: Payer: Self-pay | Admitting: Nurse Practitioner

## 2017-10-06 MED ORDER — MISC. DEVICES MISC: 0 refills | Status: AC

## 2017-10-06 NOTE — Telephone Encounter (Signed)
Patient's prescription is under miscellaneous. It can be printed out when they come to pick it up. She can take it to any medical supply store and have it ordered.

## 2017-10-06 NOTE — Telephone Encounter (Signed)
CMA spoke to patient mom to inform that when she come and get it, will print it out for her.

## 2017-10-31 ENCOUNTER — Telehealth: Payer: Self-pay | Admitting: Nurse Practitioner

## 2017-10-31 NOTE — Telephone Encounter (Signed)
Patient mother called stating that the Rx for a walker that the patient had asked for needs to say wide bariatric walker. Please f/u

## 2017-11-01 NOTE — Telephone Encounter (Signed)
Prescription has been written.

## 2017-11-03 NOTE — Telephone Encounter (Signed)
CMA spoke to patient's mother to inform the script is ready for her to pick up. Patient's mother understood.

## 2018-06-12 ENCOUNTER — Encounter: Payer: Self-pay | Admitting: Internal Medicine

## 2018-06-12 ENCOUNTER — Ambulatory Visit: Payer: Medicaid Other | Attending: Family Medicine | Admitting: Internal Medicine

## 2018-06-12 VITALS — BP 124/64 | HR 100 | Temp 97.6°F | Resp 16

## 2018-06-12 DIAGNOSIS — I10 Essential (primary) hypertension: Secondary | ICD-10-CM | POA: Diagnosis not present

## 2018-06-12 DIAGNOSIS — M62831 Muscle spasm of calf: Secondary | ICD-10-CM | POA: Diagnosis not present

## 2018-06-12 DIAGNOSIS — G8929 Other chronic pain: Secondary | ICD-10-CM | POA: Diagnosis not present

## 2018-06-12 DIAGNOSIS — Z8249 Family history of ischemic heart disease and other diseases of the circulatory system: Secondary | ICD-10-CM | POA: Insufficient documentation

## 2018-06-12 DIAGNOSIS — Z87828 Personal history of other (healed) physical injury and trauma: Secondary | ICD-10-CM | POA: Diagnosis not present

## 2018-06-12 DIAGNOSIS — G8921 Chronic pain due to trauma: Secondary | ICD-10-CM | POA: Insufficient documentation

## 2018-06-12 DIAGNOSIS — Z79899 Other long term (current) drug therapy: Secondary | ICD-10-CM | POA: Diagnosis not present

## 2018-06-12 DIAGNOSIS — M546 Pain in thoracic spine: Secondary | ICD-10-CM | POA: Diagnosis not present

## 2018-06-12 DIAGNOSIS — G8221 Paraplegia, complete: Secondary | ICD-10-CM | POA: Diagnosis present

## 2018-06-12 DIAGNOSIS — Z791 Long term (current) use of non-steroidal anti-inflammatories (NSAID): Secondary | ICD-10-CM | POA: Insufficient documentation

## 2018-06-12 DIAGNOSIS — M62838 Other muscle spasm: Secondary | ICD-10-CM | POA: Diagnosis not present

## 2018-06-12 MED ORDER — GABAPENTIN 300 MG PO CAPS: 300.0000 mg | ORAL_CAPSULE | Freq: Three times a day (TID) | ORAL | 3 refills | Status: DC

## 2018-06-12 MED ORDER — TIZANIDINE HCL 2 MG PO CAPS: 2.0000 mg | ORAL_CAPSULE | Freq: Two times a day (BID) | ORAL | 2 refills | Status: DC | PRN

## 2018-06-12 NOTE — Progress Notes (Signed)
Pt states b/l legs feel like there is a shocking sensation  Pt states the sensation is once or twice a day
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
## 2018-06-12 NOTE — Patient Instructions (Signed)
Stop baclofen.  Changed to Zanaflex instead. I have referred you to a pain specialist

## 2018-06-13 ENCOUNTER — Other Ambulatory Visit: Payer: Self-pay

## 2018-06-13 DIAGNOSIS — M62838 Other muscle spasm: Secondary | ICD-10-CM

## 2018-06-13 MED ORDER — TIZANIDINE HCL 2 MG PO TABS: 2.0000 mg | ORAL_TABLET | Freq: Two times a day (BID) | ORAL | 2 refills | Status: DC | PRN

## 2018-06-13 NOTE — Telephone Encounter (Signed)
RX changed from capsule to tablet per insurance preference.

## 2018-06-28 ENCOUNTER — Ambulatory Visit: Payer: Medicaid Other | Admitting: Family Medicine

## 2018-09-12 ENCOUNTER — Other Ambulatory Visit: Payer: Self-pay

## 2018-09-12 ENCOUNTER — Encounter: Payer: Self-pay | Admitting: Nurse Practitioner

## 2018-09-12 ENCOUNTER — Ambulatory Visit: Payer: Medicaid Other | Attending: Nurse Practitioner | Admitting: Nurse Practitioner

## 2018-09-12 DIAGNOSIS — I10 Essential (primary) hypertension: Secondary | ICD-10-CM | POA: Diagnosis not present

## 2018-09-12 DIAGNOSIS — M62838 Other muscle spasm: Secondary | ICD-10-CM | POA: Diagnosis not present

## 2018-09-12 DIAGNOSIS — Z79899 Other long term (current) drug therapy: Secondary | ICD-10-CM | POA: Diagnosis not present

## 2018-09-12 DIAGNOSIS — M62831 Muscle spasm of calf: Secondary | ICD-10-CM | POA: Diagnosis not present

## 2018-09-12 DIAGNOSIS — M546 Pain in thoracic spine: Secondary | ICD-10-CM | POA: Diagnosis not present

## 2018-09-12 DIAGNOSIS — F17200 Nicotine dependence, unspecified, uncomplicated: Secondary | ICD-10-CM | POA: Insufficient documentation

## 2018-09-12 DIAGNOSIS — M79604 Pain in right leg: Secondary | ICD-10-CM | POA: Insufficient documentation

## 2018-09-12 DIAGNOSIS — M79605 Pain in left leg: Secondary | ICD-10-CM | POA: Diagnosis not present

## 2018-09-12 DIAGNOSIS — G8220 Paraplegia, unspecified: Secondary | ICD-10-CM | POA: Diagnosis not present

## 2018-09-12 DIAGNOSIS — G894 Chronic pain syndrome: Secondary | ICD-10-CM

## 2018-09-12 DIAGNOSIS — G8929 Other chronic pain: Secondary | ICD-10-CM

## 2018-09-12 MED ORDER — CYCLOBENZAPRINE HCL 5 MG PO TABS: 5.0000 mg | ORAL_TABLET | Freq: Three times a day (TID) | ORAL | 1 refills | Status: DC | PRN

## 2018-09-12 MED ORDER — DULOXETINE HCL 30 MG PO CPEP: 30.0000 mg | ORAL_CAPSULE | Freq: Every day | ORAL | 0 refills | Status: DC
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
## 2018-11-08 ENCOUNTER — Other Ambulatory Visit: Payer: Self-pay | Admitting: Nurse Practitioner

## 2018-11-08 DIAGNOSIS — G894 Chronic pain syndrome: Secondary | ICD-10-CM

## 2018-11-08 DIAGNOSIS — M62838 Other muscle spasm: Secondary | ICD-10-CM

## 2018-11-14 ENCOUNTER — Ambulatory Visit: Payer: Medicaid Other | Admitting: Nurse Practitioner

## 2019-09-07 ENCOUNTER — Telehealth: Payer: Self-pay

## 2019-09-07 ENCOUNTER — Other Ambulatory Visit: Payer: Self-pay | Admitting: Nurse Practitioner

## 2019-09-07 DIAGNOSIS — G8921 Chronic pain due to trauma: Secondary | ICD-10-CM

## 2019-09-07 NOTE — Telephone Encounter (Signed)
Will route to PCP 

## 2019-09-07 NOTE — Telephone Encounter (Signed)
Pt mother request referral to Pain Management due to pain from disability. Request referral to [facility] in [facility] [address]  [place] [phone]. Given appt 10/05/19 with Meredeth Ide. Please confirm if pt needs to keep appt or if Meredeth Ide can just send referral.

## 2019-10-05 ENCOUNTER — Ambulatory Visit: Payer: Medicaid Other | Admitting: Nurse Practitioner

## 2021-02-18 ENCOUNTER — Other Ambulatory Visit: Payer: Self-pay

## 2021-02-18 ENCOUNTER — Encounter (HOSPITAL_BASED_OUTPATIENT_CLINIC_OR_DEPARTMENT_OTHER): Payer: Self-pay | Admitting: Emergency Medicine

## 2021-02-18 DIAGNOSIS — I10 Essential (primary) hypertension: Secondary | ICD-10-CM | POA: Diagnosis not present

## 2021-02-18 DIAGNOSIS — S199XXA Unspecified injury of neck, initial encounter: Secondary | ICD-10-CM | POA: Insufficient documentation

## 2021-02-18 DIAGNOSIS — S20212A Contusion of left front wall of thorax, initial encounter: Secondary | ICD-10-CM | POA: Diagnosis not present

## 2021-02-18 DIAGNOSIS — F17200 Nicotine dependence, unspecified, uncomplicated: Secondary | ICD-10-CM | POA: Insufficient documentation

## 2021-02-18 DIAGNOSIS — Y9389 Activity, other specified: Secondary | ICD-10-CM | POA: Insufficient documentation

## 2021-02-18 DIAGNOSIS — S299XXA Unspecified injury of thorax, initial encounter: Secondary | ICD-10-CM | POA: Diagnosis present

## 2021-02-18 DIAGNOSIS — S3992XA Unspecified injury of lower back, initial encounter: Secondary | ICD-10-CM | POA: Insufficient documentation

## 2021-02-18 DIAGNOSIS — Y92410 Unspecified street and highway as the place of occurrence of the external cause: Secondary | ICD-10-CM | POA: Diagnosis not present
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
## 2021-02-19 ENCOUNTER — Emergency Department (HOSPITAL_BASED_OUTPATIENT_CLINIC_OR_DEPARTMENT_OTHER)
Admission: EM | Admit: 2021-02-19 | Discharge: 2021-02-19 | Disposition: A | Discharge status: Home / Self Care | Payer: Medicare Other | Attending: Emergency Medicine | Admitting: Emergency Medicine

## 2021-02-19 ENCOUNTER — Emergency Department (HOSPITAL_BASED_OUTPATIENT_CLINIC_OR_DEPARTMENT_OTHER): Payer: Medicare Other | Admitting: Radiology

## 2021-02-19 DIAGNOSIS — S20212A Contusion of left front wall of thorax, initial encounter: Secondary | ICD-10-CM

## 2021-02-19 MED ORDER — OXYCODONE-ACETAMINOPHEN 5-325 MG PO TABS
1.0000 | ORAL_TABLET | Freq: Once | ORAL | Status: AC
  Administered 2021-02-19: 1 via ORAL
  Filled 2021-02-19: qty 1

## 2021-02-19 MED ORDER — NAPROXEN 250 MG PO TABS: 500.0000 mg | ORAL_TABLET | Freq: Two times a day (BID) | ORAL | 0 refills | Status: DC

## 2021-02-19 MED ORDER — OXYCODONE HCL 5 MG PO TABS: 5.0000 mg | ORAL_TABLET | ORAL | 0 refills | Status: DC | PRN

## 2021-02-19 MED ORDER — IBUPROFEN 400 MG PO TABS
400.0000 mg | ORAL_TABLET | Freq: Once | ORAL | Status: AC
  Administered 2021-02-19: 400 mg via ORAL
  Filled 2021-02-19: qty 1
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
## 2021-02-19 NOTE — Discharge Instructions (Signed)
Apply ice for 30 minutes at a time, 4 times a day.  You may add acetaminophen to the naproxen to give you additional pain relief.  Reserve oxycodone for pain not relieved by naproxen and acetaminophen.

## 2021-08-28 ENCOUNTER — Other Ambulatory Visit: Payer: Self-pay

## 2021-08-28 ENCOUNTER — Emergency Department (HOSPITAL_BASED_OUTPATIENT_CLINIC_OR_DEPARTMENT_OTHER)
Admission: EM | Admit: 2021-08-28 | Discharge: 2021-08-28 | Disposition: A | Discharge status: Home / Self Care | Payer: 59 | Attending: Emergency Medicine | Admitting: Emergency Medicine

## 2021-08-28 ENCOUNTER — Encounter (HOSPITAL_BASED_OUTPATIENT_CLINIC_OR_DEPARTMENT_OTHER): Payer: Self-pay

## 2021-08-28 DIAGNOSIS — G9001 Carotid sinus syncope: Secondary | ICD-10-CM | POA: Diagnosis not present

## 2021-08-28 DIAGNOSIS — M542 Cervicalgia: Secondary | ICD-10-CM | POA: Diagnosis present

## 2021-08-28 LAB — GROUP A STREP BY PCR: Group A Strep by PCR: NOT DETECTED

## 2021-08-28 LAB — MONONUCLEOSIS SCREEN: Mono Screen: NEGATIVE

## 2021-08-28 MED ORDER — IBUPROFEN 800 MG PO TABS
800.0000 mg | ORAL_TABLET | Freq: Once | ORAL | Status: AC
  Administered 2021-08-28: 800 mg via ORAL
  Filled 2021-08-28: qty 1

## 2021-08-28 MED ORDER — IBUPROFEN 600 MG PO TABS: 600.0000 mg | ORAL_TABLET | Freq: Four times a day (QID) | ORAL | 0 refills | Status: AC | PRN

## 2021-08-28 NOTE — Discharge Instructions (Signed)
You are likely having carotidynia which is a mild inflammation of your carotid artery. This typically goes away on its own after a day or two. Take motrin if needed for the pain. Follow up with the Vascular clinic if not improving by middle of next week.

## 2021-08-28 NOTE — ED Provider Notes (Signed)
[facility] EMERGENCY DEPT  Provider Note  CSN: [id number] Arrival date & time: 08/28/21 0054  History Chief Complaint  Patient presents with   Sore Throat    Alexander Nielsen is a 26 y.o. male who is wheelchair bound from prior spine injury reports left sided neck pain/sore throat for the last day. He denies pain with swallowing, no fevers. Has felt a lump in his left neck.    Home Medications Prior to Admission medications   Medication Sig Start Date End Date Taking? Authorizing Provider  ibuprofen (ADVIL) 600 MG tablet Take 1 tablet (600 mg total) by mouth every 6 (six) hours as needed. 08/28/21  Yes Pollyann Savoy, MD  bacitracin ointment Apply 1 application topically 2 (two) times daily. 01/10/17   Juliet Rude, PA-C  Misc. Devices MISC Please provide patient with a medically approved rollator walker with seat 10/06/17   Claiborne Rigg, NP     Allergies    Patient has no known allergies.   Review of Systems   Review of Systems Please see HPI for pertinent positives and negatives  Physical Exam BP (!) 147/88   Pulse (!) 102   Temp 98.1 F (36.7 C) (Oral)   Resp 18   Ht 5\' 6"  (1.676 m)   Wt 77.1 kg   SpO2 97%   BMI 27.44 kg/m   Physical Exam Vitals and nursing note reviewed.  Constitutional:      Appearance: Normal appearance.  HENT:     Head: Normocephalic and atraumatic.     Nose: Nose normal.     Mouth/Throat:     Mouth: Mucous membranes are moist.     Pharynx: No oropharyngeal exudate or posterior oropharyngeal erythema.  Eyes:     Extraocular Movements: Extraocular movements intact.     Conjunctiva/sclera: Conjunctivae normal.  Cardiovascular:     Rate and Rhythm: Normal rate.  Pulmonary:     Effort: Pulmonary effort is normal.     Breath sounds: Normal breath sounds.  Abdominal:     General: Abdomen is flat.     Palpations: Abdomen is soft.     Tenderness: There is no abdominal tenderness.  Musculoskeletal:         General: No swelling. Normal range of motion.     Cervical back: Neck supple. Tenderness (L anterior neck) present.  Lymphadenopathy:     Cervical: No cervical adenopathy.  Skin:    General: Skin is warm and dry.  Neurological:     General: No focal deficit present.     Mental Status: He is alert.  Psychiatric:        Mood and Affect: Mood normal.    ED Results / Procedures / Treatments   EKG None  Procedures Procedures  Medications Ordered in the ED Medications  ibuprofen (ADVIL) tablet 800 mg (800 mg Oral Given 08/28/21 0149)    Initial Impression and Plan  Patient with left neck pain, differential includes infection such as strep or mono, although his throat does not look particularly inflamed. He could also have a nonspecific inflammatory process such as carotidynia. Will check labs and given Motrin while waiting the results.   ED Course   Clinical Course as of 08/28/21 0249  Fri Aug 28, 2021  0227 Mono is neg [CS]  0242 Strep is neg.  [CS]  0245 Pain is improved with Motrin. Suspect he has carotidynia. Recommend he continue with motrin at home as needed. If pain is not resolved in  a few days, may need vascular follow up. RTED for any other concerns.  [CS]    Clinical Course User Index [CS] Pollyann Savoy, MD     MDM Rules/Calculators/A&P Medical Decision Making Problems Addressed: Carotidynia: acute illness or injury  Amount and/or Complexity of Data Reviewed Labs: ordered. Decision-making details documented in ED Course.  Risk Prescription drug management.    Final Clinical Impression(s) / ED Diagnoses Final diagnoses:  Carotidynia    Rx / DC Orders ED Discharge Orders          Ordered    ibuprofen (ADVIL) 600 MG tablet  Every 6 hours PRN        08/28/21 0247             Pollyann Savoy, MD 08/28/21 [phone]

## 2021-08-28 NOTE — ED Triage Notes (Signed)
POV, pt c/o sore throat on left side and lump on left side of neck that started this morning. Denies fever/chills, difficulty swallowing. Alert and oriented x 4, wheelchair at baseline.

## 2022-06-09 ENCOUNTER — Other Ambulatory Visit (HOSPITAL_BASED_OUTPATIENT_CLINIC_OR_DEPARTMENT_OTHER): Payer: Self-pay

## 2022-06-09 ENCOUNTER — Emergency Department (HOSPITAL_BASED_OUTPATIENT_CLINIC_OR_DEPARTMENT_OTHER)
Admission: EM | Admit: 2022-06-09 | Discharge: 2022-06-09 | Disposition: A | Discharge status: Home / Self Care | Payer: 59 | Attending: Emergency Medicine | Admitting: Emergency Medicine

## 2022-06-09 ENCOUNTER — Other Ambulatory Visit: Payer: Self-pay

## 2022-06-09 ENCOUNTER — Encounter (HOSPITAL_BASED_OUTPATIENT_CLINIC_OR_DEPARTMENT_OTHER): Payer: Self-pay

## 2022-06-09 DIAGNOSIS — J101 Influenza due to other identified influenza virus with other respiratory manifestations: Secondary | ICD-10-CM | POA: Insufficient documentation

## 2022-06-09 DIAGNOSIS — I10 Essential (primary) hypertension: Secondary | ICD-10-CM | POA: Diagnosis not present

## 2022-06-09 DIAGNOSIS — R059 Cough, unspecified: Secondary | ICD-10-CM | POA: Diagnosis present

## 2022-06-09 DIAGNOSIS — F17290 Nicotine dependence, other tobacco product, uncomplicated: Secondary | ICD-10-CM | POA: Insufficient documentation

## 2022-06-09 DIAGNOSIS — Z1152 Encounter for screening for COVID-19: Secondary | ICD-10-CM | POA: Insufficient documentation

## 2022-06-09 DIAGNOSIS — J111 Influenza due to unidentified influenza virus with other respiratory manifestations: Secondary | ICD-10-CM

## 2022-06-09 LAB — CBC WITH DIFFERENTIAL/PLATELET
Abs Immature Granulocytes: 0.02 10*3/uL (ref 0.00–0.07)
Basophils Absolute: 0.1 10*3/uL (ref 0.0–0.1)
Basophils Relative: 1 %
Eosinophils Absolute: 0 10*3/uL (ref 0.0–0.5)
Eosinophils Relative: 0 %
HCT: 43.1 % (ref 39.0–52.0)
Hemoglobin: 14.6 g/dL (ref 13.0–17.0)
Immature Granulocytes: 0 %
Lymphocytes Relative: 12 %
Lymphs Abs: 0.8 10*3/uL (ref 0.7–4.0)
MCH: 30.2 pg (ref 26.0–34.0)
MCHC: 33.9 g/dL (ref 30.0–36.0)
MCV: 89 fL (ref 80.0–100.0)
Monocytes Absolute: 0.8 10*3/uL (ref 0.1–1.0)
Monocytes Relative: 12 %
Neutro Abs: 5.2 10*3/uL (ref 1.7–7.7)
Neutrophils Relative %: 75 %
Platelets: 279 10*3/uL (ref 150–400)
RBC: 4.84 MIL/uL (ref 4.22–5.81)
RDW: 13.2 % (ref 11.5–15.5)
WBC: 6.9 10*3/uL (ref 4.0–10.5)
nRBC: 0 % (ref 0.0–0.2)

## 2022-06-09 LAB — RESP PANEL BY RT-PCR (RSV, FLU A&B, COVID)  RVPGX2
Influenza A by PCR: POSITIVE — AB
Influenza B by PCR: NEGATIVE
Resp Syncytial Virus by PCR: NEGATIVE
SARS Coronavirus 2 by RT PCR: NEGATIVE

## 2022-06-09 LAB — BASIC METABOLIC PANEL
Anion gap: 10 (ref 5–15)
BUN: 7 mg/dL (ref 6–20)
CO2: 25 mmol/L (ref 22–32)
Calcium: 10 mg/dL (ref 8.9–10.3)
Chloride: 103 mmol/L (ref 98–111)
Creatinine, Ser: 0.8 mg/dL (ref 0.61–1.24)
GFR, Estimated: >60 mL/min (ref >60)
Glucose, Bld: 111 mg/dL — ABNORMAL HIGH (ref 70–99)
Potassium: 3.8 mmol/L (ref 3.5–5.1)
Sodium: 138 mmol/L (ref 135–145)

## 2022-06-09 LAB — GROUP A STREP BY PCR: Group A Strep by PCR: NOT DETECTED

## 2022-06-09 MED ORDER — OSELTAMIVIR PHOSPHATE 75 MG PO CAPS
75.0000 mg | ORAL_CAPSULE | Freq: Two times a day (BID) | ORAL | 0 refills | Status: AC
  Filled 2022-06-09: qty 10, 5d supply, fill #0

## 2022-06-09 MED ORDER — KETOROLAC TROMETHAMINE 30 MG/ML IJ SOLN
15.0000 mg | Freq: Once | INTRAMUSCULAR | Status: AC
  Administered 2022-06-09: 15 mg via INTRAVENOUS
  Filled 2022-06-09: qty 1

## 2022-06-09 MED ORDER — SODIUM CHLORIDE 0.9 % IV BOLUS
1000.0000 mL | Freq: Once | INTRAVENOUS | Status: AC
  Administered 2022-06-09: 1000 mL via INTRAVENOUS

## 2022-06-09 MED ORDER — ONDANSETRON HCL 4 MG PO TABS
4.0000 mg | ORAL_TABLET | Freq: Four times a day (QID) | ORAL | 0 refills | Status: AC
  Filled 2022-06-09: qty 12, 3d supply, fill #0

## 2022-06-09 MED ORDER — ONDANSETRON HCL 4 MG/2ML IJ SOLN
4.0000 mg | Freq: Once | INTRAMUSCULAR | Status: AC
  Administered 2022-06-09: 4 mg via INTRAVENOUS
  Filled 2022-06-09: qty 2

## 2022-06-09 MED ORDER — LOPERAMIDE HCL 2 MG PO CAPS
2.0000 mg | ORAL_CAPSULE | Freq: Four times a day (QID) | ORAL | 0 refills | Status: AC | PRN
  Filled 2022-06-09: qty 12, 3d supply, fill #0

## 2022-06-09 NOTE — Discharge Instructions (Addendum)
You should quarantine for 7 days from the start of your symptoms to prevent the spread of infection to other people.

## 2022-06-09 NOTE — ED Notes (Signed)
RN reviewed discharge instructions w/ pt. Follow up and prescriptions reviewed, pt had no further questions °

## 2022-06-09 NOTE — ED Triage Notes (Signed)
Patient here POV from Home.  Endorses Cough, Chills, Aches, Headache for 3 Days. Worsening.  NAD Noted during Triage. A&Ox4. GCS 15. BIB Personal Wheelchair.
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
## 2022-06-09 NOTE — ED Provider Notes (Signed)
  Physical Exam  BP (!) 148/91   Pulse (!) 115   Temp 98.1 F (36.7 C) (Oral)   Resp 18   Ht 5' 7"$  (1.702 m)   Wt 72.6 kg   SpO2 98%   BMI 25.06 kg/m   Physical Exam  Procedures  Procedures  ED Course / MDM   Clinical Course as of 06/09/22 1554  Wed Jun 09, 2022  1521 Signed out to Dr. Langston Masker pending BMP and reassessment of heart rate.  [WS]    Clinical Course User Index [WS] Cristie Hem, MD   Medical Decision Making Amount and/or Complexity of Data Reviewed Labs: ordered.  Risk Prescription drug management.   Patient was received in signout.  Briefly this is a 27 year old male with a history of paraplegia presented with flulike symptoms, found to be influenza positive here, initially tachycardic but heart rate improved with medications and fluids.  Labs were personally reviewed interpreted and unremarkable, no emergent findings.  Patient was prescribed Tamiflu by earlier provider.  He is stable for discharge at this time.  Lower suspicion for sepsis, bacterial pneumonia, or other life-threatening infection based on this presentation.       Wyvonnia Dusky, MD 06/09/22 [phone]

## 2022-09-20 ENCOUNTER — Encounter (HOSPITAL_BASED_OUTPATIENT_CLINIC_OR_DEPARTMENT_OTHER): Payer: Self-pay

## 2022-09-20 ENCOUNTER — Emergency Department (HOSPITAL_BASED_OUTPATIENT_CLINIC_OR_DEPARTMENT_OTHER)
Admission: EM | Admit: 2022-09-20 | Discharge: 2022-09-20 | Disposition: A | Discharge status: Home / Self Care | Payer: 59 | Attending: Emergency Medicine | Admitting: Emergency Medicine

## 2022-09-20 ENCOUNTER — Emergency Department (HOSPITAL_BASED_OUTPATIENT_CLINIC_OR_DEPARTMENT_OTHER): Payer: 59

## 2022-09-20 ENCOUNTER — Other Ambulatory Visit: Payer: Self-pay

## 2022-09-20 DIAGNOSIS — D72829 Elevated white blood cell count, unspecified: Secondary | ICD-10-CM | POA: Diagnosis not present

## 2022-09-20 DIAGNOSIS — Z4801 Encounter for change or removal of surgical wound dressing: Secondary | ICD-10-CM | POA: Diagnosis present

## 2022-09-20 DIAGNOSIS — L03317 Cellulitis of buttock: Secondary | ICD-10-CM

## 2022-09-20 DIAGNOSIS — Q631 Lobulated, fused and horseshoe kidney: Secondary | ICD-10-CM | POA: Diagnosis not present

## 2022-09-20 LAB — LACTIC ACID, PLASMA: Lactic Acid, Venous: 1 mmol/L (ref 0.5–1.9)

## 2022-09-20 LAB — CBC WITH DIFFERENTIAL/PLATELET
Abs Immature Granulocytes: 0.12 10*3/uL — ABNORMAL HIGH (ref 0.00–0.07)
Basophils Absolute: 0.1 10*3/uL (ref 0.0–0.1)
Basophils Relative: 0 %
Eosinophils Absolute: 0 10*3/uL (ref 0.0–0.5)
Eosinophils Relative: 0 %
HCT: 42.3 % (ref 39.0–52.0)
Hemoglobin: 14.4 g/dL (ref 13.0–17.0)
Immature Granulocytes: 1 %
Lymphocytes Relative: 8 %
Lymphs Abs: 1.9 10*3/uL (ref 0.7–4.0)
MCH: 30.2 pg (ref 26.0–34.0)
MCHC: 34 g/dL (ref 30.0–36.0)
MCV: 88.7 fL (ref 80.0–100.0)
Monocytes Absolute: 1.7 10*3/uL — ABNORMAL HIGH (ref 0.1–1.0)
Monocytes Relative: 7 %
Neutro Abs: 20.4 10*3/uL — ABNORMAL HIGH (ref 1.7–7.7)
Neutrophils Relative %: 84 %
Platelets: 311 10*3/uL (ref 150–400)
RBC: 4.77 MIL/uL (ref 4.22–5.81)
RDW: 12.9 % (ref 11.5–15.5)
WBC: 24.3 10*3/uL — ABNORMAL HIGH (ref 4.0–10.5)
nRBC: 0 % (ref 0.0–0.2)

## 2022-09-20 LAB — COMPREHENSIVE METABOLIC PANEL
ALT: 15 U/L (ref 0–44)
AST: 21 U/L (ref 15–41)
Albumin: 4.3 g/dL (ref 3.5–5.0)
Alkaline Phosphatase: 63 U/L (ref 38–126)
Anion gap: 13 (ref 5–15)
BUN: 8 mg/dL (ref 6–20)
CO2: 22 mmol/L (ref 22–32)
Calcium: 9.6 mg/dL (ref 8.9–10.3)
Chloride: 100 mmol/L (ref 98–111)
Creatinine, Ser: 0.77 mg/dL (ref 0.61–1.24)
GFR, Estimated: >60 mL/min (ref >60)
Glucose, Bld: 105 mg/dL — ABNORMAL HIGH (ref 70–99)
Potassium: 4.1 mmol/L (ref 3.5–5.1)
Sodium: 135 mmol/L (ref 135–145)
Total Bilirubin: 0.5 mg/dL (ref 0.3–1.2)
Total Protein: 7.5 g/dL (ref 6.5–8.1)

## 2022-09-20 LAB — LIPASE, BLOOD: Lipase: 11 U/L (ref 11–51)

## 2022-09-20 LAB — TROPONIN I (HIGH SENSITIVITY): Troponin I (High Sensitivity): <2 ng/L (ref <18)

## 2022-09-20 MED ORDER — CLINDAMYCIN HCL 300 MG PO CAPS: 300.0000 mg | ORAL_CAPSULE | Freq: Three times a day (TID) | ORAL | 0 refills | Status: AC

## 2022-09-20 MED ORDER — IOHEXOL 300 MG/ML  SOLN
100.0000 mL | Freq: Once | INTRAMUSCULAR | Status: AC | PRN
  Administered 2022-09-20: 80 mL via INTRAVENOUS

## 2022-09-20 MED ORDER — LACTATED RINGERS IV BOLUS
1000.0000 mL | Freq: Once | INTRAVENOUS | Status: AC
  Administered 2022-09-20: 1000 mL via INTRAVENOUS

## 2022-09-20 MED ORDER — CLINDAMYCIN HCL 150 MG PO CAPS
300.0000 mg | ORAL_CAPSULE | Freq: Once | ORAL | Status: AC
  Administered 2022-09-20: 300 mg via ORAL
  Filled 2022-09-20: qty 2

## 2022-09-20 NOTE — Discharge Instructions (Addendum)
You were seen today for soft tissue infection of the skin of your buttock into the left thigh. We have discussed management including potential need for admission for IV antibiotics.  You have stated that you would prefer outpatient antibiotics by mouth as a trial first. Notably if you are not improving within the next 72 hours or if you begin to worsen at all, you will need to return immediately for further care so this does not become a necrotizing infection. Will trial clindamycin 3 times daily for the next 7 days and you will need to be seen by your primary care provider soon to ensure that you continue to improve.  If at anytime you feel your symptoms are getting worse please return immediately for admission.
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
## 2022-09-20 NOTE — ED Notes (Signed)
RN reviewed discharge instructions with pt. Pt verbalized understanding and had no further questions. VSS upon discharge.  

## 2022-09-20 NOTE — ED Notes (Signed)
Patient transported to CT 

## 2022-09-20 NOTE — ED Triage Notes (Signed)
Patient here POV from Home.  Endorses Wound (2, 1 to Left Buttock Area and 1 to Left Groin/Thigh Area). No Fevers. States it began after using new briefs. History of Paralysis and is Wheelchair Bound.   NAD Noted during Triage. A&Ox4. GCS 15. BIB Personal Wheelchair.

## 2022-09-26 LAB — CULTURE, BLOOD (ROUTINE X 2)
Culture: NO GROWTH
Special Requests: ADEQUATE

## 2023-05-02 ENCOUNTER — Other Ambulatory Visit (HOSPITAL_COMMUNITY): Payer: Self-pay

## 2023-05-03 ENCOUNTER — Other Ambulatory Visit (HOSPITAL_COMMUNITY): Payer: Self-pay

## 2023-05-04 ENCOUNTER — Other Ambulatory Visit (HOSPITAL_COMMUNITY): Payer: Self-pay

## 2023-05-04 MED ORDER — OXYCODONE-ACETAMINOPHEN 10-325 MG PO TABS
1.0000 | ORAL_TABLET | Freq: Four times a day (QID) | ORAL | 0 refills | Status: AC | PRN
  Filled 2023-05-04: qty 120, 30d supply, fill #0

## 2023-06-29 ENCOUNTER — Other Ambulatory Visit (HOSPITAL_COMMUNITY): Payer: Self-pay

## 2023-06-29 MED ORDER — TIZANIDINE HCL 2 MG PO TABS
4.0000 mg | ORAL_TABLET | Freq: Three times a day (TID) | ORAL | 0 refills | Status: AC | PRN
  Filled 2023-06-29: qty 180, 30d supply, fill #0

## 2023-06-29 MED ORDER — OXYCODONE-ACETAMINOPHEN 10-325 MG PO TABS
1.0000 | ORAL_TABLET | Freq: Every day | ORAL | 0 refills | Status: AC
  Filled 2023-06-29: qty 150, 30d supply, fill #0

## 2023-07-27 ENCOUNTER — Other Ambulatory Visit (HOSPITAL_COMMUNITY): Payer: Self-pay

## 2023-07-27 MED ORDER — NALOXONE HCL 4 MG/0.1ML NA LIQD
1.0000 | NASAL | 0 refills | Status: AC
  Filled 2023-07-27: qty 2, 1d supply, fill #0

## 2023-07-27 MED ORDER — CYCLOBENZAPRINE HCL 10 MG PO TABS
10.0000 mg | ORAL_TABLET | Freq: Three times a day (TID) | ORAL | 0 refills | Status: AC
  Filled 2023-07-27 (×2): qty 90, 30d supply, fill #0

## 2023-07-27 MED ORDER — OXYCODONE-ACETAMINOPHEN 10-325 MG PO TABS
1.0000 | ORAL_TABLET | Freq: Every day | ORAL | 0 refills | Status: AC
  Filled 2023-07-27: qty 150, 30d supply, fill #0

## 2023-08-25 ENCOUNTER — Other Ambulatory Visit (HOSPITAL_COMMUNITY): Payer: Self-pay

## 2023-08-25 MED ORDER — TRAZODONE HCL 50 MG PO TABS
50.0000 mg | ORAL_TABLET | Freq: Every evening | ORAL | 0 refills | Status: AC | PRN
  Filled 2023-08-25: qty 45, 23d supply, fill #0

## 2023-08-25 MED ORDER — OXYCODONE-ACETAMINOPHEN 10-325 MG PO TABS
1.0000 | ORAL_TABLET | ORAL | 0 refills | Status: AC | PRN
  Filled 2023-08-25: qty 180, 30d supply, fill #0

## 2023-08-25 MED ORDER — BACLOFEN 10 MG PO TABS
10.0000 mg | ORAL_TABLET | Freq: Four times a day (QID) | ORAL | 0 refills | Status: AC
  Filled 2023-08-25: qty 360, 90d supply, fill #0

## 2023-09-08 ENCOUNTER — Emergency Department (HOSPITAL_BASED_OUTPATIENT_CLINIC_OR_DEPARTMENT_OTHER)

## 2023-09-08 ENCOUNTER — Other Ambulatory Visit: Payer: Self-pay

## 2023-09-08 ENCOUNTER — Encounter (HOSPITAL_BASED_OUTPATIENT_CLINIC_OR_DEPARTMENT_OTHER): Payer: Self-pay | Admitting: *Deleted

## 2023-09-08 ENCOUNTER — Emergency Department (HOSPITAL_BASED_OUTPATIENT_CLINIC_OR_DEPARTMENT_OTHER)
Admission: EM | Admit: 2023-09-08 | Discharge: 2023-09-08 | Disposition: A | Discharge status: Home / Self Care | Attending: Emergency Medicine | Admitting: Emergency Medicine

## 2023-09-08 DIAGNOSIS — R519 Headache, unspecified: Secondary | ICD-10-CM | POA: Diagnosis present

## 2023-09-08 DIAGNOSIS — Z79899 Other long term (current) drug therapy: Secondary | ICD-10-CM | POA: Insufficient documentation

## 2023-09-08 DIAGNOSIS — B349 Viral infection, unspecified: Secondary | ICD-10-CM | POA: Diagnosis not present

## 2023-09-08 DIAGNOSIS — I10 Essential (primary) hypertension: Secondary | ICD-10-CM | POA: Insufficient documentation

## 2023-09-08 DIAGNOSIS — R109 Unspecified abdominal pain: Secondary | ICD-10-CM | POA: Insufficient documentation

## 2023-09-08 LAB — RESP PANEL BY RT-PCR (RSV, FLU A&B, COVID)  RVPGX2
Influenza A by PCR: NEGATIVE
Influenza B by PCR: NEGATIVE
Resp Syncytial Virus by PCR: NEGATIVE
SARS Coronavirus 2 by RT PCR: NEGATIVE

## 2023-09-08 LAB — CBC WITH DIFFERENTIAL/PLATELET
Abs Immature Granulocytes: 0.05 10*3/uL (ref 0.00–0.07)
Basophils Absolute: 0.1 10*3/uL (ref 0.0–0.1)
Basophils Relative: 1 %
Eosinophils Absolute: 0.3 10*3/uL (ref 0.0–0.5)
Eosinophils Relative: 3 %
HCT: 43.2 % (ref 39.0–52.0)
Hemoglobin: 14.8 g/dL (ref 13.0–17.0)
Immature Granulocytes: 0 %
Lymphocytes Relative: 36 %
Lymphs Abs: 4.2 10*3/uL — ABNORMAL HIGH (ref 0.7–4.0)
MCH: 30.6 pg (ref 26.0–34.0)
MCHC: 34.3 g/dL (ref 30.0–36.0)
MCV: 89.3 fL (ref 80.0–100.0)
Monocytes Absolute: 0.7 10*3/uL (ref 0.1–1.0)
Monocytes Relative: 6 %
Neutro Abs: 6.1 10*3/uL (ref 1.7–7.7)
Neutrophils Relative %: 54 %
Platelets: 376 10*3/uL (ref 150–400)
RBC: 4.84 MIL/uL (ref 4.22–5.81)
RDW: 12.9 % (ref 11.5–15.5)
WBC: 11.5 10*3/uL — ABNORMAL HIGH (ref 4.0–10.5)
nRBC: 0 % (ref 0.0–0.2)

## 2023-09-08 LAB — COMPREHENSIVE METABOLIC PANEL WITH GFR
ALT: 18 U/L (ref 0–44)
AST: 26 U/L (ref 15–41)
Albumin: 4.3 g/dL (ref 3.5–5.0)
Alkaline Phosphatase: 89 U/L (ref 38–126)
Anion gap: 15 (ref 5–15)
BUN: 8 mg/dL (ref 6–20)
CO2: 21 mmol/L — ABNORMAL LOW (ref 22–32)
Calcium: 9.6 mg/dL (ref 8.9–10.3)
Chloride: 104 mmol/L (ref 98–111)
Creatinine, Ser: 0.88 mg/dL (ref 0.61–1.24)
GFR, Estimated: >60 mL/min (ref >60)
Glucose, Bld: 97 mg/dL (ref 70–99)
Potassium: 3.8 mmol/L (ref 3.5–5.1)
Sodium: 141 mmol/L (ref 135–145)
Total Bilirubin: <0.2 mg/dL (ref 0.0–1.2)
Total Protein: 7.4 g/dL (ref 6.5–8.1)

## 2023-09-08 LAB — URINALYSIS, ROUTINE W REFLEX MICROSCOPIC
Bilirubin Urine: NEGATIVE
Glucose, UA: NEGATIVE mg/dL
Hgb urine dipstick: NEGATIVE
Ketones, ur: NEGATIVE mg/dL
Nitrite: NEGATIVE
Protein, ur: NEGATIVE mg/dL
Specific Gravity, Urine: 1.009 (ref 1.005–1.030)
pH: 6.5 (ref 5.0–8.0)

## 2023-09-08 LAB — LACTIC ACID, PLASMA
Lactic Acid, Venous: 2.1 mmol/L (ref 0.5–1.9)
Lactic Acid, Venous: 1.4 mmol/L (ref 0.5–1.9)

## 2023-09-08 MED ORDER — IBUPROFEN 800 MG PO TABS
800.0000 mg | ORAL_TABLET | Freq: Once | ORAL | Status: AC
  Administered 2023-09-08: 800 mg via ORAL
  Filled 2023-09-08: qty 1

## 2023-09-08 MED ORDER — SODIUM CHLORIDE 0.9 % IV BOLUS
1000.0000 mL | Freq: Once | INTRAVENOUS | Status: AC
  Administered 2023-09-08: 1000 mL via INTRAVENOUS

## 2023-09-08 NOTE — ED Notes (Addendum)
 Note entered in error

## 2023-09-08 NOTE — ED Notes (Signed)
 CRITICAL VALUE STICKER  CRITICAL VALUE: Lactic Acid 2.1  RECEIVER (on-site recipient of call): Willetta Harpin RN  DATE & TIME NOTIFIED: 3:12 AM   MESSENGER (representative from lab):  MD NOTIFIED: DeLo  TIME OF NOTIFICATION: 3:12 AM
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
## 2023-09-08 NOTE — Discharge Instructions (Signed)
 Drink plenty of fluids and get plenty of rest.  Take ibuprofen  600 mg every 6 hours as needed for pain or fever.  Return to the ER if symptoms significantly worsen or change.

## 2023-09-08 NOTE — ED Triage Notes (Addendum)
 Pt is here for headaches and lower abdominal and lower back pain and body aches and hot flashes.  No sick contacts.  Pt does self cath for urine.

## 2023-09-10 LAB — URINE CULTURE: Culture: >=100000 — AB

## 2023-09-11 ENCOUNTER — Telehealth (HOSPITAL_BASED_OUTPATIENT_CLINIC_OR_DEPARTMENT_OTHER): Payer: Self-pay | Admitting: *Deleted

## 2023-09-11 NOTE — Telephone Encounter (Signed)
 Post ED Visit - Positive Culture Follow-up: Successful Patient Follow-Up  Culture assessed and recommendations reviewed by:  []  [place], Pharm.D. []  Skeet Duke, Pharm.D., BCPS AQ-ID []  Leslee Rase, Pharm.D., BCPS []  Garland Junk, [address].D., BCPS []  [place], [address].D., BCPS, AAHIVP []  Alcide Aly, Pharm.D., BCPS, AAHIVP []  Jerri Morale, PharmD, BCPS []  Graham Laws, PharmD, BCPS []  Cleda Curly, PharmD, BCPS [x]  Trinidad Funk, PharmD  Positive urine culture  [x]  Patient discharged without antimicrobial prescription and treatment is now indicated []  Organism is resistant to prescribed ED discharge antimicrobial []  Patient with positive blood cultures  Called for symptom check. Pt is having some UTI symptoms. Complains of dark urine and low output and feels like he has a UTI.  Changes discussed with ED provider: Dorothey Gate, PA-C New antibiotic prescription Cipro 500mg  BID x 5 days Called to [facility] Dr. Jonette Nestle  Contacted patient, date 09/11/23 , time 1020   Georgine Kitchens 09/11/2023, 10:20 AM

## 2023-09-11 NOTE — Progress Notes (Signed)
 ED Antimicrobial Stewardship Positive Culture Follow Up   Alexander Nielsen is an 28 y.o. male who presented to [facility] on 09/08/2023 with a chief complaint of  Chief Complaint  Patient presents with   Headache   Abdominal Pain    Recent Results (from the past 720 hours)  Culture, blood (routine x 2)     Status: None (Preliminary result)   Collection Time: 09/08/23  1:47 AM   Specimen: Left Antecubital; Blood  Result Value Ref Range Status   Specimen Description   Final    LEFT ANTECUBITAL Performed at [facility], [address], [place], [place] [address]    Special Requests   Final    BOTTLES DRAWN AEROBIC AND ANAEROBIC Blood Culture results may not be optimal due to an inadequate volume of blood received in culture bottles Performed at [facility], [address], [place], [place] [address]    Culture   Final    NO GROWTH 3 DAYS Performed at [facility], [address] [place], [place] [address]    Report Status PENDING  Incomplete  Resp panel by RT-PCR (RSV, Flu A&B, Covid) Anterior Nasal Swab     Status: None   Collection Time: 09/08/23  1:51 AM   Specimen: Anterior Nasal Swab  Result Value Ref Range Status   SARS Coronavirus 2 by RT PCR NEGATIVE NEGATIVE Final    Comment: (NOTE) SARS-CoV-2 target nucleic acids are NOT DETECTED.  The SARS-CoV-2 RNA is generally detectable in upper respiratory specimens during the acute phase of infection. The lowest concentration of SARS-CoV-2 viral copies this assay can detect is 138 copies/mL. A negative result does not preclude SARS-Cov-2 infection and should not be used as the sole basis for treatment or other patient management decisions. A negative result may occur with  improper specimen collection/handling, submission of specimen other than nasopharyngeal swab, presence of viral mutation(s) within the areas targeted by this assay, and inadequate number of viral copies(<138  copies/mL). A negative result must be combined with clinical observations, patient history, and epidemiological information. The expected result is Negative.  Fact Sheet for Patients:  BloggerCourse.com  Fact Sheet for Healthcare Providers:  SeriousBroker.it  This test is no t yet approved or cleared by the [place]  FDA and  has been authorized for detection and/or diagnosis of SARS-CoV-2 by FDA under an Emergency Use Authorization (EUA). This EUA will remain  in effect (meaning this test can be used) for the duration of the COVID-19 declaration under Section 564(b)(1) of the Act, 21 U.S.C.section 360bbb-3(b)(1), unless the authorization is terminated  or revoked sooner.       Influenza A by PCR NEGATIVE NEGATIVE Final   Influenza B by PCR NEGATIVE NEGATIVE Final    Comment: (NOTE) The Xpert Xpress SARS-CoV-2/FLU/RSV plus assay is intended as an aid in the diagnosis of influenza from Nasopharyngeal swab specimens and should not be used as a sole basis for treatment. Nasal washings and aspirates are unacceptable for Xpert Xpress SARS-CoV-2/FLU/RSV testing.  Fact Sheet for Patients: BloggerCourse.com  Fact Sheet for Healthcare Providers: SeriousBroker.it  This test is not yet approved or cleared by the [place]  FDA and has been authorized for detection and/or diagnosis of SARS-CoV-2 by FDA under an Emergency Use Authorization (EUA). This EUA will remain in effect (meaning this test can be used) for the duration of the COVID-19 declaration under Section 564(b)(1) of the Act, 21 U.S.C. section 360bbb-3(b)(1), unless the authorization is terminated or revoked.  Resp Syncytial Virus by PCR NEGATIVE NEGATIVE Final    Comment: (NOTE) Fact Sheet for Patients: BloggerCourse.com  Fact Sheet for Healthcare  Providers: SeriousBroker.it  This test is not yet approved or cleared by the [place]  FDA and has been authorized for detection and/or diagnosis of SARS-CoV-2 by FDA under an Emergency Use Authorization (EUA). This EUA will remain in effect (meaning this test can be used) for the duration of the COVID-19 declaration under Section 564(b)(1) of the Act, 21 U.S.C. section 360bbb-3(b)(1), unless the authorization is terminated or revoked.  Performed at [facility], [address] [place], [place] [address]   Urine Culture     Status: Abnormal   Collection Time: 09/08/23  2:26 AM   Specimen: Urine, Catheterized  Result Value Ref Range Status   Specimen Description   Final    URINE, CATHETERIZED Performed at [facility], [address] [place], [place] [address]    Special Requests   Final    NONE Performed at [facility], [address] [place], [place] [address]    Culture >=100,000 COLONIES/mL KLEBSIELLA AEROGENES (A)  Final   Report Status 09/10/2023 FINAL  Final   Organism ID, Bacteria KLEBSIELLA AEROGENES (A)  Final      Susceptibility   Klebsiella aerogenes - MIC*    CEFEPIME <=0.12 SENSITIVE Sensitive     CEFTRIAXONE <=0.25 SENSITIVE Sensitive     CIPROFLOXACIN <=0.25 SENSITIVE Sensitive     GENTAMICIN <=1 SENSITIVE Sensitive     IMIPENEM 2 SENSITIVE Sensitive     NITROFURANTOIN 64 INTERMEDIATE Intermediate     TRIMETH/SULFA <=20 SENSITIVE Sensitive     PIP/TAZO <=4 SENSITIVE Sensitive ug/mL    * >=100,000 COLONIES/mL KLEBSIELLA AEROGENES    Plan:  Call pt for sx check, if negative for sx do not treat, if positive give Cipro  ED Provider: Marcia Setters, PA-C   Trinidad Funk 09/11/2023, 10:03 AM Clinical Pharmacist Monday - Friday phone -  [phone] Saturday - Sunday phone - [phone]

## 2023-09-13 LAB — CULTURE, BLOOD (ROUTINE X 2): Culture: NO GROWTH

## 2023-09-23 ENCOUNTER — Other Ambulatory Visit: Payer: Self-pay

## 2023-09-23 ENCOUNTER — Other Ambulatory Visit (HOSPITAL_COMMUNITY): Payer: Self-pay

## 2023-09-23 MED ORDER — OXYCODONE-ACETAMINOPHEN 10-325 MG PO TABS
1.0000 | ORAL_TABLET | ORAL | 0 refills | Status: AC
  Filled 2023-09-23: qty 180, 30d supply, fill #0

## 2023-09-23 MED ORDER — BACLOFEN 20 MG PO TABS
30.0000 mg | ORAL_TABLET | Freq: Three times a day (TID) | ORAL | 0 refills | Status: DC
  Filled 2023-09-23: qty 90, 20d supply, fill #0

## 2023-09-23 MED ORDER — NALOXONE HCL 4 MG/0.1ML NA LIQD
1.0000 | NASAL | 0 refills | Status: AC | PRN
  Filled 2023-09-23: qty 2, 1d supply, fill #0

## 2023-10-19 ENCOUNTER — Other Ambulatory Visit (HOSPITAL_COMMUNITY): Payer: Self-pay

## 2023-10-19 MED ORDER — BACLOFEN 20 MG PO TABS
30.0000 mg | ORAL_TABLET | Freq: Three times a day (TID) | ORAL | 0 refills | Status: DC
  Filled 2023-10-21: qty 405, 90d supply, fill #0

## 2023-10-19 MED ORDER — OXYCODONE-ACETAMINOPHEN 10-325 MG PO TABS
1.0000 | ORAL_TABLET | ORAL | 0 refills | Status: DC | PRN
  Filled 2023-10-21: qty 180, 30d supply, fill #0

## 2023-10-19 MED ORDER — NALOXONE HCL 4 MG/0.1ML NA LIQD: 1.0000 | NASAL | 0 refills | Status: AC

## 2023-10-21 ENCOUNTER — Other Ambulatory Visit: Payer: Self-pay

## 2023-10-21 ENCOUNTER — Other Ambulatory Visit (HOSPITAL_COMMUNITY): Payer: Self-pay

## 2023-11-14 ENCOUNTER — Other Ambulatory Visit (HOSPITAL_COMMUNITY): Payer: Self-pay

## 2023-11-14 MED ORDER — OXYCODONE-ACETAMINOPHEN 10-325 MG PO TABS
1.0000 | ORAL_TABLET | ORAL | 0 refills | Status: DC | PRN
  Filled 2023-11-14 – 2023-11-18 (×2): qty 180, 30d supply, fill #0

## 2023-11-14 MED ORDER — BACLOFEN 20 MG PO TABS: 30.0000 mg | ORAL_TABLET | Freq: Three times a day (TID) | ORAL | 0 refills | Status: AC

## 2023-11-18 ENCOUNTER — Other Ambulatory Visit (HOSPITAL_COMMUNITY): Payer: Self-pay

## 2023-11-18 ENCOUNTER — Other Ambulatory Visit: Payer: Self-pay

## 2023-12-15 ENCOUNTER — Other Ambulatory Visit (HOSPITAL_COMMUNITY): Payer: Self-pay

## 2023-12-15 MED ORDER — OXYCODONE-ACETAMINOPHEN 10-325 MG PO TABS
1.0000 | ORAL_TABLET | ORAL | 0 refills | Status: DC | PRN
  Filled 2023-12-19: qty 180, 30d supply, fill #0

## 2023-12-15 MED ORDER — NALOXONE HCL 4 MG/0.1ML NA LIQD
1.0000 | NASAL | 1 refills | Status: AC | PRN
  Filled 2023-12-15: qty 2, 15d supply, fill #0

## 2023-12-16 ENCOUNTER — Other Ambulatory Visit (HOSPITAL_COMMUNITY): Payer: Self-pay

## 2023-12-19 ENCOUNTER — Other Ambulatory Visit (HOSPITAL_COMMUNITY): Payer: Self-pay

## 2024-01-16 ENCOUNTER — Other Ambulatory Visit (HOSPITAL_COMMUNITY): Payer: Self-pay

## 2024-01-16 ENCOUNTER — Other Ambulatory Visit: Payer: Self-pay

## 2024-01-16 MED ORDER — BACLOFEN 20 MG PO TABS
30.0000 mg | ORAL_TABLET | Freq: Three times a day (TID) | ORAL | 0 refills | Status: AC
  Filled 2024-01-16: qty 405, 90d supply, fill #0

## 2024-01-16 MED ORDER — OXYCODONE-ACETAMINOPHEN 10-325 MG PO TABS
1.0000 | ORAL_TABLET | ORAL | 0 refills | Status: DC | PRN
  Filled 2024-01-16: qty 180, 30d supply, fill #0

## 2024-01-17 ENCOUNTER — Other Ambulatory Visit (HOSPITAL_COMMUNITY): Payer: Self-pay

## 2024-01-18 ENCOUNTER — Other Ambulatory Visit (HOSPITAL_COMMUNITY): Payer: Self-pay

## 2024-01-25 ENCOUNTER — Other Ambulatory Visit (HOSPITAL_COMMUNITY): Payer: Self-pay

## 2024-02-07 ENCOUNTER — Other Ambulatory Visit: Payer: Self-pay

## 2024-02-15 ENCOUNTER — Other Ambulatory Visit (HOSPITAL_COMMUNITY): Payer: Self-pay

## 2024-02-15 MED ORDER — OXYCODONE-ACETAMINOPHEN 10-325 MG PO TABS
1.0000 | ORAL_TABLET | ORAL | 0 refills | Status: DC | PRN
  Filled 2024-02-15: qty 180, 30d supply, fill #0

## 2024-02-16 ENCOUNTER — Other Ambulatory Visit (HOSPITAL_COMMUNITY): Payer: Self-pay

## 2024-03-14 ENCOUNTER — Other Ambulatory Visit: Payer: Self-pay

## 2024-03-14 ENCOUNTER — Other Ambulatory Visit (HOSPITAL_COMMUNITY): Payer: Self-pay

## 2024-03-14 MED ORDER — CARISOPRODOL 350 MG PO TABS
350.0000 mg | ORAL_TABLET | Freq: Three times a day (TID) | ORAL | 0 refills | Status: DC | PRN
  Filled 2024-03-14: qty 90, 30d supply, fill #0

## 2024-03-14 MED ORDER — OXYCODONE-ACETAMINOPHEN 10-325 MG PO TABS
1.0000 | ORAL_TABLET | ORAL | 0 refills | Status: DC | PRN
  Filled 2024-03-15: qty 180, 30d supply, fill #0

## 2024-03-15 ENCOUNTER — Other Ambulatory Visit (HOSPITAL_COMMUNITY): Payer: Self-pay

## 2024-03-19 ENCOUNTER — Other Ambulatory Visit (HOSPITAL_COMMUNITY): Payer: Self-pay

## 2024-04-13 ENCOUNTER — Other Ambulatory Visit (HOSPITAL_COMMUNITY): Payer: Self-pay

## 2024-04-13 MED ORDER — BACLOFEN 20 MG PO TABS
30.0000 mg | ORAL_TABLET | Freq: Three times a day (TID) | ORAL | 0 refills | Status: AC
  Filled 2024-04-13: qty 405, 90d supply, fill #0

## 2024-04-13 MED ORDER — OXYCODONE-ACETAMINOPHEN 10-325 MG PO TABS
1.0000 | ORAL_TABLET | Freq: Three times a day (TID) | ORAL | 0 refills | Status: DC | PRN
  Filled 2024-04-13: qty 90, 30d supply, fill #0

## 2024-04-19 ENCOUNTER — Other Ambulatory Visit (HOSPITAL_COMMUNITY): Payer: Self-pay

## 2024-04-19 MED ORDER — OXYCODONE-ACETAMINOPHEN 10-325 MG PO TABS
1.0000 | ORAL_TABLET | Freq: Three times a day (TID) | ORAL | 0 refills | Status: AC | PRN
  Filled 2024-04-27: qty 90, 30d supply, fill #0

## 2024-04-19 MED ORDER — CARISOPRODOL 350 MG PO TABS
350.0000 mg | ORAL_TABLET | Freq: Three times a day (TID) | ORAL | 0 refills | Status: DC | PRN
  Filled 2024-04-19: qty 90, 30d supply, fill #0

## 2024-04-20 ENCOUNTER — Other Ambulatory Visit: Payer: Self-pay

## 2024-04-26 ENCOUNTER — Other Ambulatory Visit (HOSPITAL_COMMUNITY): Payer: Self-pay

## 2024-04-27 ENCOUNTER — Other Ambulatory Visit (HOSPITAL_COMMUNITY): Payer: Self-pay

## 2024-04-28 ENCOUNTER — Other Ambulatory Visit (HOSPITAL_COMMUNITY): Payer: Self-pay

## 2024-04-28 MED ORDER — OXYCODONE-ACETAMINOPHEN 10-325 MG PO TABS
1.0000 | ORAL_TABLET | Freq: Every day | ORAL | 0 refills | Status: AC
  Filled 2024-04-28 (×2): qty 90, 15d supply, fill #0

## 2024-04-30 ENCOUNTER — Other Ambulatory Visit: Payer: Self-pay

## 2024-05-13 ENCOUNTER — Other Ambulatory Visit: Payer: Self-pay

## 2024-05-15 ENCOUNTER — Other Ambulatory Visit (HOSPITAL_COMMUNITY): Payer: Self-pay

## 2024-05-15 MED ORDER — CARISOPRODOL 350 MG PO TABS
350.0000 mg | ORAL_TABLET | Freq: Three times a day (TID) | ORAL | 0 refills | Status: AC | PRN
  Filled 2024-05-15: qty 90, 30d supply, fill #0

## 2024-05-15 MED ORDER — OXYCODONE-ACETAMINOPHEN 10-325 MG PO TABS
1.0000 | ORAL_TABLET | ORAL | 0 refills | Status: AC | PRN
  Filled 2024-05-15: qty 180, 30d supply, fill #0
